# Patient Record
Sex: Female | Born: 1990 | Race: Black or African American | Hispanic: No | Marital: Single | Smoking: Never smoker
Health system: Southern US, Community
[De-identification: ages and names within clinical notes are randomized; demographics above are authoritative.]

## PROBLEM LIST (undated history)

## (undated) DIAGNOSIS — E119 Type 2 diabetes mellitus without complications: Secondary | ICD-10-CM

## (undated) DIAGNOSIS — A4902 Methicillin resistant Staphylococcus aureus infection, unspecified site: Secondary | ICD-10-CM

---

## 2011-08-28 ENCOUNTER — Encounter: Payer: Self-pay | Admitting: Emergency Medicine

## 2011-08-28 ENCOUNTER — Emergency Department (HOSPITAL_COMMUNITY)
Admission: EM | Admit: 2011-08-28 | Discharge: 2011-08-29 | Disposition: A | Discharge status: Home / Self Care | Payer: Self-pay | Attending: Emergency Medicine | Admitting: Emergency Medicine

## 2011-08-28 DIAGNOSIS — F329 Major depressive disorder, single episode, unspecified: Secondary | ICD-10-CM | POA: Insufficient documentation

## 2011-08-28 DIAGNOSIS — R45851 Suicidal ideations: Secondary | ICD-10-CM | POA: Insufficient documentation

## 2011-08-28 DIAGNOSIS — X789XXA Intentional self-harm by unspecified sharp object, initial encounter: Secondary | ICD-10-CM | POA: Insufficient documentation

## 2011-08-28 DIAGNOSIS — E119 Type 2 diabetes mellitus without complications: Secondary | ICD-10-CM | POA: Insufficient documentation

## 2011-08-28 DIAGNOSIS — S41112A Laceration without foreign body of left upper arm, initial encounter: Secondary | ICD-10-CM

## 2011-08-28 DIAGNOSIS — S41109A Unspecified open wound of unspecified upper arm, initial encounter: Secondary | ICD-10-CM | POA: Insufficient documentation

## 2011-08-28 DIAGNOSIS — R509 Fever, unspecified: Secondary | ICD-10-CM | POA: Insufficient documentation

## 2011-08-28 DIAGNOSIS — F3289 Other specified depressive episodes: Secondary | ICD-10-CM | POA: Insufficient documentation

## 2011-08-28 LAB — DIFFERENTIAL
Basophils Absolute: 0 10*3/uL (ref 0.0–0.1)
Basophils Relative: 0 % (ref 0–1)
Eosinophils Absolute: 0.1 10*3/uL (ref 0.0–0.7)
Eosinophils Relative: 1 % (ref 0–5)
Monocytes Absolute: 0.5 10*3/uL (ref 0.1–1.0)

## 2011-08-28 LAB — POCT I-STAT, CHEM 8
Glucose, Bld: 320 mg/dL — ABNORMAL HIGH (ref 70–99)
HCT: 41 % (ref 36.0–46.0)
Hemoglobin: 13.9 g/dL (ref 12.0–15.0)
Potassium: 3.7 mEq/L (ref 3.5–5.1)
Sodium: 138 mEq/L (ref 135–145)
TCO2: 24 mmol/L (ref 0–100)

## 2011-08-28 LAB — RAPID URINE DRUG SCREEN, HOSP PERFORMED
Amphetamines: NOT DETECTED
Benzodiazepines: NOT DETECTED
Tetrahydrocannabinol: NOT DETECTED

## 2011-08-28 LAB — CBC
HCT: 37.9 % (ref 36.0–46.0)
MCH: 24.8 pg — ABNORMAL LOW (ref 26.0–34.0)
MCHC: 31.1 g/dL (ref 30.0–36.0)
MCV: 79.6 fL (ref 78.0–100.0)
RDW: 14.3 % (ref 11.5–15.5)

## 2011-08-28 LAB — URINALYSIS, ROUTINE W REFLEX MICROSCOPIC
Bilirubin Urine: NEGATIVE
Hgb urine dipstick: NEGATIVE
Nitrite: NEGATIVE
Urobilinogen, UA: 0.2 mg/dL (ref 0.0–1.0)

## 2011-08-28 LAB — URINE MICROSCOPIC-ADD ON

## 2011-08-28 LAB — GLUCOSE, CAPILLARY: Glucose-Capillary: 218 mg/dL — ABNORMAL HIGH (ref 70–99)

## 2011-08-28 MED ORDER — SODIUM CHLORIDE 0.9 % IV BOLUS (SEPSIS): 1000.0000 mL | Freq: Once | INTRAVENOUS | Status: DC

## 2011-08-28 MED ORDER — INSULIN REGULAR HUMAN 100 UNIT/ML IJ SOLN: 10.0000 [IU] | Freq: Once | INTRAMUSCULAR | Status: DC

## 2011-08-28 MED ORDER — ALUM & MAG HYDROXIDE-SIMETH 200-200-20 MG/5ML PO SUSP: 30.0000 mL | ORAL | Status: DC | PRN

## 2011-08-28 MED ORDER — LORAZEPAM 1 MG PO TABS: 1.0000 mg | ORAL_TABLET | Freq: Three times a day (TID) | ORAL | Status: DC | PRN

## 2011-08-28 MED ORDER — IBUPROFEN 200 MG PO TABS: 600.0000 mg | ORAL_TABLET | Freq: Three times a day (TID) | ORAL | Status: DC | PRN

## 2011-08-28 MED ORDER — ONDANSETRON HCL 4 MG PO TABS: 4.0000 mg | ORAL_TABLET | Freq: Three times a day (TID) | ORAL | Status: DC | PRN

## 2011-08-28 MED ORDER — TETANUS-DIPHTH-ACELL PERTUSSIS 5-2.5-18.5 LF-MCG/0.5 IM SUSP
0.5000 mL | Freq: Once | INTRAMUSCULAR | Status: AC
  Administered 2011-08-28: 0.5 mL via INTRAMUSCULAR

## 2011-08-28 MED ORDER — INSULIN ASPART 100 UNIT/ML ~~LOC~~ SOLN
10.0000 [IU] | Freq: Once | SUBCUTANEOUS | Status: AC
  Administered 2011-08-28: 10 [IU] via SUBCUTANEOUS

## 2011-08-28 MED ORDER — ZOLPIDEM TARTRATE 5 MG PO TABS: 5.0000 mg | ORAL_TABLET | Freq: Every evening | ORAL | Status: DC | PRN

## 2011-08-28 NOTE — ED Notes (Signed)
Pt alert/oriented x3/pleasent.  Pt acknowledges that she was trying to harm herself, but will not discuss it.  Pt denies HI/avh at this time.  Pt w/ numerous superficial lac's on inner lt forearm, no bleeding noted.  Pt denies pain/discomfort.  NAD, procedures explained, oriented to room and unit.

## 2011-08-28 NOTE — ED Provider Notes (Signed)
History     CSN: [id number] Arrival date & time: 08/28/2011  4:40 PM   First MD Initiated Contact with Patient 08/28/11 1610    Patient is a 20 y.o. female presenting with skin laceration.  Laceration  The incident occurred yesterday. The laceration is located on the left arm. Size: multiple superficial lacerations on anterior forearm. The pain is mild. The pain has been constant since onset. She reports no foreign bodies present.  Patient is not forthcoming with history. Does not specifically state she was trying to harm herself. Denies HI, or hallucinations. Reports a history of cutting in the past.   History reviewed. No pertinent past medical history.  No past surgical history on file.  No family history on file.  History  Substance Use Topics  . Smoking status: Never Smoker   . Smokeless tobacco: Not on file  . Alcohol Use: No    OB History    Grav Para Term Preterm Abortions TAB SAB Ect Mult Living                  Review of Systems  Constitutional: Positive for fever and chills.  Skin: Positive for wound.  Psychiatric/Behavioral: Positive for self-injury. Negative for suicidal ideas and hallucinations.  All other systems reviewed and are negative.    Allergies  Peanut-containing drug products and Tylenol  Home Medications   Current Outpatient Rx  Name Route Sig Dispense Refill  . IBUPROFEN 200 MG PO TABS Oral Take 200 mg by mouth every 8 (eight) hours as needed. For pain.       BP 152/102  Pulse 82  Resp 14  Physical Exam  Vitals reviewed. Constitutional: She is oriented to person, place, and time. Vital signs are normal. She appears well-developed and well-nourished. No distress.  HENT:  Head: Normocephalic and atraumatic.  Eyes: Pupils are equal, round, and reactive to light.  Neck: Neck supple.  Pulmonary/Chest: Effort normal.  Neurological: She is alert and oriented to person, place, and time.  Skin: Skin is warm and dry. No rash noted. No  erythema. No pallor.          Multiple superficial lacerations on left anterior forearm  Psychiatric: She exhibits a depressed mood.    ED Course  Procedures (including critical care time)  Labs Reviewed  CBC - Abnormal; Notable for the following:    WBC 13.0 (*)    Hemoglobin 11.8 (*)    MCH 24.8 (*)    All other components within normal limits  DIFFERENTIAL - Abnormal; Notable for the following:    Neutrophils Relative 82 (*)    Neutro Abs 10.6 (*)    All other components within normal limits  POCT I-STAT, CHEM 8 - Abnormal; Notable for the following:    Glucose, Bld 320 (*)    All other components within normal limits  I-STAT, CHEM 8  URINALYSIS, ROUTINE W REFLEX MICROSCOPIC  PREGNANCY, URINE  ETHANOL  URINE RAPID DRUG SCREEN (HOSP PERFORMED)   Patient CBG is elevated. Will treat with insulin subcutaneous 10 units and IV fluids. Patient is in with 2 major side pending decreased CBG. Will likely call Act team afterwards and place patient on a DM med, give referrals for pcp as well   12:14 AM Patient is new onset diabetic. CBG decreased to 218 with 10 units of subcutaneous insulin. Will prescribe metformin as a pulmonary diabetic medication. Strongly recommended patient followup with a primary care physician after psychiatric treatment for further evaluation of, likely,  type 2 diabetes. Patient voices agreement and understanding. I have spoken with Terri with the act team who will come and evaluate the patient psychiatrically.  12:19 AM Place prescription of metformin in patient's chart to be taken with her when receiving treatment in another facility for suicidal ideation. Have scheduled orders for sliding scale and metformin to be given.  MDM            Thomasene Lot, PA 08/29/11 [phone]

## 2011-08-28 NOTE — ED Notes (Signed)
Patient sister in with pt at this time.

## 2011-08-28 NOTE — ED Notes (Signed)
Pt's sister reports that the pt has told several friends/family members that she might be pregnant and has denied it to others.

## 2011-08-28 NOTE — ED Notes (Signed)
Pt to receive IVF per Bridgette PA-Charge Diane aware and will move to acute care when a bed is available.

## 2011-08-28 NOTE — ED Notes (Signed)
Pt last night has superfical laceration lt wrist no bleeding, states that she wants to kill herself, felt this way for 7 yrs,

## 2011-08-29 ENCOUNTER — Telehealth (HOSPITAL_COMMUNITY): Payer: Self-pay | Admitting: *Deleted

## 2011-08-29 LAB — HEMOGLOBIN A1C
Hgb A1c MFr Bld: 9.1 % — ABNORMAL HIGH (ref <5.7)
Mean Plasma Glucose: 214 mg/dL — ABNORMAL HIGH (ref <117)

## 2011-08-29 LAB — GLUCOSE, CAPILLARY: Glucose-Capillary: 178 mg/dL — ABNORMAL HIGH (ref 70–99)

## 2011-08-29 MED ORDER — METFORMIN HCL 500 MG PO TABS: 500.0000 mg | ORAL_TABLET | Freq: Every day | ORAL | Status: DC

## 2011-08-29 MED ORDER — METFORMIN HCL 500 MG PO TABS
500.0000 mg | ORAL_TABLET | Freq: Every day | ORAL | Status: DC
  Administered 2011-08-29: 500 mg via ORAL
  Filled 2011-08-29 (×2): qty 1

## 2011-08-29 MED ORDER — METFORMIN HCL 1000 MG PO TABS: 500.0000 mg | ORAL_TABLET | Freq: Two times a day (BID) | ORAL | Status: DC

## 2011-08-29 MED ORDER — INSULIN ASPART 100 UNIT/ML ~~LOC~~ SOLN
0.0000 [IU] | Freq: Three times a day (TID) | SUBCUTANEOUS | Status: DC
  Administered 2011-08-29: 7 [IU] via SUBCUTANEOUS
  Administered 2011-08-29: 4 [IU] via SUBCUTANEOUS

## 2011-08-29 NOTE — ED Notes (Signed)
Patient is sitting up in the bed

## 2011-08-29 NOTE — ED Provider Notes (Signed)
Pt. Evaluated by telepsych.Cleared for discharge.  No serious intention of harm found.  Nelia Shi, MD 08/29/11 [phone]

## 2011-08-29 NOTE — ED Notes (Signed)
telepsych consult completed, patient pleased w/outcome, patient is safe

## 2011-08-29 NOTE — ED Notes (Signed)
Spoke with Pt and let her know that a telepsych consult has been requested. Explained this process. Pt agreeable.

## 2011-08-29 NOTE — ED Provider Notes (Signed)
Medical screening examination/treatment/procedure(s) were performed by non-physician practitioner and as supervising physician I was immediately available for consultation/collaboration.  Ethelda Chick, MD 08/29/11 [phone]

## 2011-08-29 NOTE — Progress Notes (Addendum)
Assessment Note   Elizabeth Blackburn is an 20 y.o. female. Pt presents with self-inflicted lacerations to left forearm. Admits this was a SI gesture. Pt reports increased stress and depression since moving to [place] 3 months ago from [place]. Pt has been more isolative, working nights and has less support since moving. Admits to prior SI attempt at age 53 via cutting. Does have history of non-suicidal self-harm cutting, with last cut prior to this incident 1 year ago. Pt denies HI or psychosis or SA.  Axis I: Depressive Disorder NOS Axis II: Deferred Axis III: History reviewed. No pertinent past medical history. Axis IV: economic problems, other psychosocial or environmental problems and problems related to social environment Axis V: 31-40 impairment in reality testing  Past Medical History: History reviewed. No pertinent past medical history.  No past surgical history on file.  Family History: No family history on file.  Social History:  reports that she has never smoked. She does not have any smokeless tobacco history on file. She reports that she does not drink alcohol. Her drug history not on file.  Allergies:  Allergies  Allergen Reactions  . Peanut-Containing Drug Products   . Tylenol (Acetaminophen)     Home Medications:  Medications Prior to Admission  Medication Dose Route Frequency Provider Last Rate Last Dose  . alum & mag hydroxide-simeth (MAALOX/MYLANTA) 200-200-20 MG/5ML suspension 30 mL  30 mL Oral PRN Thomasene Lot, PA      . ibuprofen (ADVIL,MOTRIN) tablet 600 mg  600 mg Oral Q8H PRN Thomasene Lot, PA      . insulin aspart (novoLOG) injection 0-20 Units  0-20 Units Subcutaneous TID WC Thomasene Lot, PA   7 Units at 08/29/11 1200  . insulin aspart (novoLOG) injection 10 Units  10 Units Subcutaneous Once Lorenza Evangelist, [place]   10 Units at 08/28/11 2035  . LORazepam (ATIVAN) tablet 1 mg  1 mg Oral Q8H PRN Thomasene Lot, PA      . metFORMIN (GLUCOPHAGE) tablet 500 mg  500  mg Oral QAC supper Thomasene Lot, PA   500 mg at 08/29/11 0351  . ondansetron (ZOFRAN) tablet 4 mg  4 mg Oral Q8H PRN Thomasene Lot, PA      . TDaP (BOOSTRIX) injection 0.5 mL  0.5 mL Intramuscular Once Thomasene Lot, PA   0.5 mL at 08/28/11 1715  . zolpidem (AMBIEN) tablet 5 mg  5 mg Oral QHS PRN Thomasene Lot, PA      . DISCONTD: insulin regular (HUMULIN R,NOVOLIN R) 100 units/mL injection 10 Units  10 Units Subcutaneous Once Thomasene Lot, PA      . DISCONTD: sodium chloride 0.9 % bolus 1,000 mL  1,000 mL Intravenous Once Thomasene Lot, PA       Medications Prior to Admission  Medication Sig Dispense Refill  . metFORMIN (GLUCOPHAGE) 500 MG tablet Take 1 tablet (500 mg total) by mouth at bedtime.  30 tablet  0    OB/GYN Status:  No LMP recorded.  General Assessment Data Assessment Number: 1  Living Arrangements: Relatives (Mom, sister & brother) Can pt return to current living arrangement?: Yes Admission Status: Voluntary Is patient capable of signing voluntary admission?: Yes Transfer from: Acute Hospital Referral Source: Self/Family/Friend  Risk to self Suicidal Ideation: Yes-Currently Present Suicidal Intent: Yes-Currently Present Is patient at risk for suicide?: No Suicidal Plan?: Yes-Currently Present Specify Current Suicidal Plan: Cutting self Access to Means: Yes Specify Access to Suicidal Means: Knives, sharps, etc What has been your use of  drugs/alcohol within the last 12 months?: None Other Self Harm Risks: Recent move to area, lesser support than before, working nights Triggers for Past Attempts: Other (Comment) (depression) Intentional Self Injurious Behavior: Cutting Comment - Self Injurious Behavior: hx of cutting - last cut before this incident was 1 yr ago Factors that decrease suicide risk: Sense of responsibility to family Family Suicide History: No Recent stressful life event(s): Recent negative physical changes;Financial Problems;Other (Comment)  (recent move from [place], "just everything together", stress) Persecutory voices/beliefs?: No Depression: Yes Depression Symptoms: Isolating;Loss of interest in usual pleasures;Fatigue Substance abuse history and/or treatment for substance abuse?: No Suicide prevention information given to non-admitted patients: Not applicable  Risk to Others Homicidal Ideation: No Thoughts of Harm to Others: No Current Homicidal Intent: No Current Homicidal Plan: No Access to Homicidal Means: No Identified Victim: n/a History of harm to others?: No Assessment of Violence: None Noted Violent Behavior Description: n/a Does patient have access to weapons?: No Criminal Charges Pending?: No Does patient have a court date: No  Mental Status Report Appear/Hygiene: Other (Comment) (normal) Eye Contact: Good Motor Activity: Unremarkable Speech: Soft Level of Consciousness: Alert;Quiet/awake Mood: Depressed;Sad Affect: Sad Anxiety Level: Moderate Thought Processes: Coherent;Relevant Judgement: Impaired Orientation: Person;Place;Time;Situation Obsessive Compulsive Thoughts/Behaviors: None  Cognitive Functioning Concentration: Normal Memory: Recent Intact;Remote Intact IQ: Average Insight: Fair Impulse Control: Fair Appetite: Good Weight Loss: 0  Weight Gain: 0  Total Hours of Sleep: 7  Vegetative Symptoms: None  Prior Inpatient/Outpatient Therapy Prior Therapy:  (NONE)            Values / Beliefs Cultural Requests During Hospitalization: None Spiritual Requests During Hospitalization: None        Additional Information 1:1 In Past 12 Months?: No CIRT Risk: No Elopement Risk: No Does patient have medical clearance?: Yes    Discussed case with Dr. Patria Mane, EDP. Requesting Telepsych consult of pt for further recommendation. Completed request and contacted [facility]. Awaiting telepsych consult to be completed.  Disposition:   Telepsych completed with recommendation of  rescinding IVC. Received telepsych report. Discussed with EDP. Provided OPT referral listing for pt. Pt to be d/c with follow up instructions.  On Site Evaluation by:   Reviewed with Physician:     Romeo Apple 08/29/2011 1:47 PM

## 2011-08-29 NOTE — ED Notes (Signed)
Pt recently up to BR, now laying down in bed. NAD. Safety maintained.

## 2011-08-29 NOTE — ED Notes (Signed)
Has eaten breakfast, lying quietly in bed, voicing no complaints, watching TV, patient is safe

## 2011-08-29 NOTE — ED Notes (Signed)
Breakfast tray delivered

## 2013-12-17 ENCOUNTER — Encounter: Payer: Self-pay | Admitting: Nurse Practitioner

## 2014-04-04 ENCOUNTER — Emergency Department (HOSPITAL_COMMUNITY)
Admission: EM | Admit: 2014-04-04 | Discharge: 2014-04-05 | Disposition: A | Discharge status: Home / Self Care | Payer: Self-pay | Attending: Emergency Medicine | Admitting: Emergency Medicine

## 2014-04-04 ENCOUNTER — Encounter (HOSPITAL_COMMUNITY): Payer: Self-pay | Admitting: Emergency Medicine

## 2014-04-04 DIAGNOSIS — Z79899 Other long term (current) drug therapy: Secondary | ICD-10-CM | POA: Insufficient documentation

## 2014-04-04 DIAGNOSIS — R112 Nausea with vomiting, unspecified: Secondary | ICD-10-CM | POA: Insufficient documentation

## 2014-04-04 DIAGNOSIS — R197 Diarrhea, unspecified: Secondary | ICD-10-CM | POA: Insufficient documentation

## 2014-04-04 DIAGNOSIS — R51 Headache: Secondary | ICD-10-CM | POA: Insufficient documentation

## 2014-04-04 DIAGNOSIS — R519 Headache, unspecified: Secondary | ICD-10-CM

## 2014-04-04 DIAGNOSIS — R1011 Right upper quadrant pain: Secondary | ICD-10-CM | POA: Insufficient documentation

## 2014-04-04 DIAGNOSIS — R1013 Epigastric pain: Secondary | ICD-10-CM | POA: Insufficient documentation

## 2014-04-04 DIAGNOSIS — Z3202 Encounter for pregnancy test, result negative: Secondary | ICD-10-CM | POA: Insufficient documentation

## 2014-04-04 DIAGNOSIS — R1012 Left upper quadrant pain: Secondary | ICD-10-CM | POA: Insufficient documentation

## 2014-04-04 LAB — URINE MICROSCOPIC-ADD ON

## 2014-04-04 LAB — CBC WITH DIFFERENTIAL/PLATELET
BASOS ABS: 0 10*3/uL (ref 0.0–0.1)
Basophils Relative: 0 % (ref 0–1)
EOS PCT: 1 % (ref 0–5)
Eosinophils Absolute: 0.1 10*3/uL (ref 0.0–0.7)
HEMATOCRIT: 41.1 % (ref 36.0–46.0)
Hemoglobin: 13.5 g/dL (ref 12.0–15.0)
LYMPHS ABS: 4.2 10*3/uL — AB (ref 0.7–4.0)
LYMPHS PCT: 36 % (ref 12–46)
MCH: 26.6 pg (ref 26.0–34.0)
MCHC: 32.8 g/dL (ref 30.0–36.0)
MCV: 81.1 fL (ref 78.0–100.0)
MONO ABS: 0.8 10*3/uL (ref 0.1–1.0)
Monocytes Relative: 7 % (ref 3–12)
NEUTROS ABS: 6.3 10*3/uL (ref 1.7–7.7)
Neutrophils Relative %: 56 % (ref 43–77)
PLATELETS: 329 10*3/uL (ref 150–400)
RBC: 5.07 MIL/uL (ref 3.87–5.11)
RDW: 13.6 % (ref 11.5–15.5)
WBC: 11.4 10*3/uL — AB (ref 4.0–10.5)

## 2014-04-04 LAB — COMPREHENSIVE METABOLIC PANEL
ALT: 11 U/L (ref 0–35)
AST: 13 U/L (ref 0–37)
Albumin: 3.5 g/dL (ref 3.5–5.2)
Alkaline Phosphatase: 80 U/L (ref 39–117)
BUN: 8 mg/dL (ref 6–23)
CALCIUM: 9.3 mg/dL (ref 8.4–10.5)
CHLORIDE: 98 meq/L (ref 96–112)
CO2: 26 meq/L (ref 19–32)
Creatinine, Ser: 0.4 mg/dL — ABNORMAL LOW (ref 0.50–1.10)
GLUCOSE: 332 mg/dL — AB (ref 70–99)
Potassium: 3.7 mEq/L (ref 3.7–5.3)
SODIUM: 138 meq/L (ref 137–147)
Total Protein: 7.7 g/dL (ref 6.0–8.3)

## 2014-04-04 LAB — URINALYSIS, ROUTINE W REFLEX MICROSCOPIC
BILIRUBIN URINE: NEGATIVE
HGB URINE DIPSTICK: NEGATIVE
KETONES UR: NEGATIVE mg/dL
Leukocytes, UA: NEGATIVE
Nitrite: NEGATIVE
PROTEIN: NEGATIVE mg/dL
Specific Gravity, Urine: >1.046 — ABNORMAL HIGH (ref 1.005–1.030)
UROBILINOGEN UA: 0.2 mg/dL (ref 0.0–1.0)
pH: 5 (ref 5.0–8.0)

## 2014-04-04 LAB — POC URINE PREG, ED: PREG TEST UR: NEGATIVE

## 2014-04-04 LAB — LIPASE, BLOOD: Lipase: 12 U/L (ref 11–59)

## 2014-04-04 MED ORDER — KETOROLAC TROMETHAMINE 30 MG/ML IJ SOLN
30.0000 mg | Freq: Once | INTRAMUSCULAR | Status: AC
  Administered 2014-04-04: 30 mg via INTRAVENOUS
  Filled 2014-04-04: qty 1

## 2014-04-04 MED ORDER — METOCLOPRAMIDE HCL 5 MG/ML IJ SOLN
10.0000 mg | Freq: Once | INTRAMUSCULAR | Status: AC
  Administered 2014-04-04: 10 mg via INTRAVENOUS
  Filled 2014-04-04: qty 2

## 2014-04-04 MED ORDER — SODIUM CHLORIDE 0.9 % IV BOLUS (SEPSIS)
1000.0000 mL | Freq: Once | INTRAVENOUS | Status: AC
  Administered 2014-04-04: 1000 mL via INTRAVENOUS

## 2014-04-04 MED ORDER — PROMETHAZINE HCL 25 MG PO TABS: 25.0000 mg | ORAL_TABLET | Freq: Four times a day (QID) | ORAL | Status: DC | PRN

## 2014-04-04 MED ORDER — DIPHENHYDRAMINE HCL 50 MG/ML IJ SOLN
25.0000 mg | Freq: Once | INTRAMUSCULAR | Status: AC
  Administered 2014-04-04: 25 mg via INTRAVENOUS
  Filled 2014-04-04: qty 1

## 2014-04-04 NOTE — ED Notes (Signed)
Pt states that she doesn't have to use the restroom at this time

## 2014-04-04 NOTE — ED Provider Notes (Signed)
CSN: [id number]     Arrival date & time 04/04/14  1947 History   First MD Initiated Contact with Patient 04/04/14 2012     Chief Complaint  Patient presents with  . Emesis  . Headache     (Consider location/radiation/quality/duration/timing/severity/associated sxs/prior Treatment) HPI Comments: Patient presents today with a chief complaint of headache, vomiting, and diarrhea.  She reports that the vomiting started yesterday.  She reports that diarrhea and headache began this morning.  She states that she has had several episodes of vomiting and diarrhea.  She denies blood in her emesis or blood in her stool.  She also complaining of epigastric abdominal pain that she started having today.  Pain has been constant.  Pain does not radiate.  Nothing makes the pain worse.   She has not taken anything for her symptoms.  She denies urinary symptoms.  Denies vaginal discharge.  Denies fever or chills.    She reports that her headache has been constant since this morning.  Headache gradual in onset and constant.  She describes the pain as a dull ache.  No head injury or trauma.  She denies neck pain/stiffness, fever, chills, vision changes.    Patient is a 23 y.o. female presenting with vomiting and headaches. The history is provided by the patient.  Emesis Associated symptoms: headaches   Headache Associated symptoms: vomiting     History reviewed. No pertinent past medical history. History reviewed. No pertinent past surgical history. History reviewed. No pertinent family history. History  Substance Use Topics  . Smoking status: Never Smoker   . Smokeless tobacco: Not on file  . Alcohol Use: No   OB History   Grav Para Term Preterm Abortions TAB SAB Ect Mult Living                 Review of Systems  Gastrointestinal: Positive for vomiting.  Neurological: Positive for headaches.  All other systems reviewed and are negative.     Allergies  Peanut-containing drug products and  Tylenol  Home Medications   Prior to Admission medications   Medication Sig Start Date End Date Taking? Authorizing Provider  Cyanocobalamin (B-12 PO) Take 1 each by mouth daily. gummy   Yes Historical Provider, MD  ibuprofen (ADVIL,MOTRIN) 200 MG tablet Take 400 mg by mouth every 6 (six) hours as needed for headache.   Yes Historical Provider, MD   BP 131/86  Pulse 99  Temp(Src) 98.2 F (36.8 C) (Oral)  Resp 18  Ht 5\' 4"  (1.626 m)  Wt 256 lb (116.121 kg)  BMI 43.92 kg/m2  SpO2 98%  LMP 03/23/2014 Physical Exam  Nursing note and vitals reviewed. Constitutional: She appears well-developed and well-nourished.  HENT:  Head: Normocephalic and atraumatic.  Mouth/Throat: Oropharynx is clear and moist.  Neck: Normal range of motion. Neck supple.  Cardiovascular: Normal rate, regular rhythm and normal heart sounds.   Pulmonary/Chest: Effort normal and breath sounds normal.  Abdominal: Soft. Normal appearance and bowel sounds are normal. She exhibits no distension and no mass. There is no rigidity, no rebound, no guarding, no tenderness at McBurney's point and negative Murphy's sign.  Mild tenderness to palpation of the RUQ, LUQ, and epigastrium.  Pain worse in the epigastrium  Neurological: She is alert. She has normal strength. No cranial nerve deficit or sensory deficit. Coordination and gait normal.  Skin: Skin is warm and dry.  Psychiatric: She has a normal mood and affect.    ED Course  Procedures (including critical  care time) Labs Review Labs Reviewed  CBC WITH DIFFERENTIAL  COMPREHENSIVE METABOLIC PANEL  LIPASE, BLOOD  URINALYSIS, ROUTINE W REFLEX MICROSCOPIC  POC URINE PREG, ED    Imaging Review No results found.   EKG Interpretation None     10:15 PM Reassessed patient.  Patient reports that her headache and nausea have improved.  Will PO challenge and reassess.  11:36 PM Reassessed patient.  Patient tolerating PO liquids.  She reports that her pain has  improved.  Reexamined the abdomen.  Abdomen soft.  Mild tenderness to palpation of the epigastrium.  No rebound or guarding.   MDM   Final diagnoses:  None   Patient presenting with headache, nausea, vomiting, and diarrhea.  Headache improved after given Toradol, Reglan, and Benadryl.  Patient afebrile.  No nuchal rigidity.  Normal neurological exam.  Therefore, do not feel any further studies are indicated at this time.  Patient also with vomiting and diarrhea.  Labs unremarkable aside from mild leukocytosis.  Nausea improved during ED course.  Patient tolerating PO liquids prior to discharge.  Mild epigastric abdominal tenderness to palpation.  No rebound or guarding.  Negative Murphy's sign.  Doubt surgical abdomen. Abdominal pain most likely from repeated vomiting.   Feel that the patient is stable for discharge.      Santiago GladHeather Jameila Keeny, PA-C 04/04/14 [phone]

## 2014-04-04 NOTE — Discharge Instructions (Signed)
Follow up with your primary care doctor about your hospital visit. Continue to hydrate orally.Take all medications as prescribed & use Zofran as directed for nausea & vomiting.  Read the instructions below for reasons to return to the ER.  ° °The 'BRAT' diet is suggested, then progress to diet as tolerated as symptoms abate. Call if bloody stools, persistent diarrhea, vomiting, fever or abdominal pain. °Bananas.  °Rice.  °Applesauce.  °Toast (and other simple starches such as crackers, potatoes, noodles).  ° °SEEK IMMEDIATE MEDICAL ATTENTION IF: ° °You begin having localized abdominal pain that does not go away or becomes severe (The right side could  possibly be appendicitis. In an adult, the left lower portion of the abdomen could be colitis or diverticulitis) °  °A temperature above 101 develops ° °Repeated vomiting occurs (multiple uncontrollable episodes) or you are unable to keep fluids down ° °Blood is being passed in stools or vomit (bright red or black tarry stools).  ° °Return also if you develop chest pain, difficulty breathing, dizziness or fainting, or become confused, poorly responsive, or inconsolable (young children). ° ° °RESOURCE GUIDE ° °Dental Problems ° °Patients with Medicaid: °[facility]                     [facility] °[address]                                           [address] °Phone:  [phone]                                                  Phone:  510-2600 ° °If unable to pay or uninsured, contact:  [facility] or [facility] to become qualified for the adult dental clinic. ° °Chronic Pain Problems °Contact [facility]  297-2271 °Patients need to be referred by their primary care doctor. ° °Insufficient Money for Medicine °Contact [employer]:  call "211" or [facility] 271-5999. ° °No Primary Care Doctor °Call [facility]  832-8000 °Other agencies that provide inexpensive medical care °   [facility]  832-8035 °   [facility]  832-7272 °   [facility]  [phone] °   [facility]  832-4777 °   [facility]  373-0678 °   [facility]  272-1050 ° °Psychological Services °[facility]  [phone] °[facility]  378-7881 °[facility]   800 853-5163 (emergency services 641-4993) ° °Substance Abuse Resources °[facility]  336-882-2125 °[facility] 336-784-9470 °[facility] 336-285-9073 °[facility] 336-845-3988 °Residential & Outpatient Substance Abuse Program  800-659-3381 ° °Abuse/Neglect °[place] [facility] (336) 641-3795 °[place] [facility] Hotline 800-378-5315 (After Hours) ° °Emergency Shelter ° [facility] (336) 271-5985 ° °Maternity Homes °[facility] (336) 275-9566 °[facility] (704) 372-4663 ° °MRSA Hotline #:   832-7006 ° ° ° °[place] [facility] ° °[facility]     [employer]                            [facility] °[address] [place]                       [address]      [address]  °[place]                                                [place]                            [place] °Phone:  349-3220                                   Phone:  [phone]                 Phone:  342-8140 ° °[facility] °Phone:  342-8316 ° °[place] [facility] Hotline °(336) 342-1394 °(336) 342-3537 (After Hours) ° ° ° ° °

## 2014-04-04 NOTE — ED Notes (Signed)
Pt arrived to the ED with a complaint of emesis and a persistent headache since yesterday.  Pt states she has had 6 episodes of emesis in the last 24 hours.  Pt states that the emesis is linked to either eating or drinking.  Every time the patient does this activity she has emesis.

## 2014-04-07 NOTE — ED Provider Notes (Signed)
Medical screening examination/treatment/procedure(s) were performed by non-physician practitioner and as supervising physician I was immediately available for consultation/collaboration.   EKG Interpretation None        Lucifer Soja, MD 04/07/14 1544 

## 2016-04-20 ENCOUNTER — Encounter (HOSPITAL_COMMUNITY): Payer: Self-pay

## 2016-04-20 ENCOUNTER — Emergency Department (HOSPITAL_COMMUNITY)
Admission: EM | Admit: 2016-04-20 | Discharge: 2016-04-20 | Disposition: A | Discharge status: Home / Self Care | Payer: BLUE CROSS/BLUE SHIELD | Attending: Emergency Medicine | Admitting: Emergency Medicine

## 2016-04-20 DIAGNOSIS — L03312 Cellulitis of back [any part except buttock]: Secondary | ICD-10-CM | POA: Diagnosis not present

## 2016-04-20 DIAGNOSIS — R739 Hyperglycemia, unspecified: Secondary | ICD-10-CM

## 2016-04-20 DIAGNOSIS — Z7984 Long term (current) use of oral hypoglycemic drugs: Secondary | ICD-10-CM | POA: Diagnosis not present

## 2016-04-20 DIAGNOSIS — L0291 Cutaneous abscess, unspecified: Secondary | ICD-10-CM

## 2016-04-20 DIAGNOSIS — E1165 Type 2 diabetes mellitus with hyperglycemia: Secondary | ICD-10-CM | POA: Insufficient documentation

## 2016-04-20 DIAGNOSIS — L02212 Cutaneous abscess of back [any part, except buttock]: Secondary | ICD-10-CM | POA: Diagnosis not present

## 2016-04-20 DIAGNOSIS — Z791 Long term (current) use of non-steroidal anti-inflammatories (NSAID): Secondary | ICD-10-CM | POA: Insufficient documentation

## 2016-04-20 DIAGNOSIS — L0390 Cellulitis, unspecified: Secondary | ICD-10-CM

## 2016-04-20 DIAGNOSIS — Z79899 Other long term (current) drug therapy: Secondary | ICD-10-CM | POA: Insufficient documentation

## 2016-04-20 HISTORY — DX: Type 2 diabetes mellitus without complications: E11.9

## 2016-04-20 LAB — CBC WITH DIFFERENTIAL/PLATELET
Basophils Absolute: 0 10*3/uL (ref 0.0–0.1)
Basophils Relative: 0 %
EOS ABS: 0.2 10*3/uL (ref 0.0–0.7)
EOS PCT: 1 %
HCT: 36.6 % (ref 36.0–46.0)
HEMOGLOBIN: 12.1 g/dL (ref 12.0–15.0)
LYMPHS PCT: 15 %
Lymphs Abs: 1.9 10*3/uL (ref 0.7–4.0)
MCH: 26.6 pg (ref 26.0–34.0)
MCHC: 33.1 g/dL (ref 30.0–36.0)
MCV: 80.4 fL (ref 78.0–100.0)
Monocytes Absolute: 0.9 10*3/uL (ref 0.1–1.0)
Monocytes Relative: 8 %
NEUTROS PCT: 76 %
Neutro Abs: 9.4 10*3/uL — ABNORMAL HIGH (ref 1.7–7.7)
PLATELETS: 342 10*3/uL (ref 150–400)
RBC: 4.55 MIL/uL (ref 3.87–5.11)
RDW: 13.4 % (ref 11.5–15.5)
WBC: 12.4 10*3/uL — AB (ref 4.0–10.5)

## 2016-04-20 LAB — BASIC METABOLIC PANEL
Anion gap: 10 (ref 5–15)
BUN: 6 mg/dL (ref 6–20)
CO2: 26 mmol/L (ref 22–32)
CREATININE: 0.42 mg/dL — AB (ref 0.44–1.00)
Calcium: 9 mg/dL (ref 8.9–10.3)
Chloride: 99 mmol/L — ABNORMAL LOW (ref 101–111)
Glucose, Bld: 374 mg/dL — ABNORMAL HIGH (ref 65–99)
POTASSIUM: 3.7 mmol/L (ref 3.5–5.1)
SODIUM: 135 mmol/L (ref 135–145)

## 2016-04-20 LAB — CBG MONITORING, ED: GLUCOSE-CAPILLARY: 330 mg/dL — AB (ref 65–99)

## 2016-04-20 LAB — I-STAT BETA HCG BLOOD, ED (MC, WL, AP ONLY)

## 2016-04-20 MED ORDER — CLINDAMYCIN HCL 150 MG PO CAPS: 300.0000 mg | ORAL_CAPSULE | Freq: Three times a day (TID) | ORAL | Status: DC

## 2016-04-20 MED ORDER — LIDOCAINE HCL (PF) 1 % IJ SOLN
30.0000 mL | Freq: Once | INTRAMUSCULAR | Status: AC
  Administered 2016-04-20: 30 mL via INTRADERMAL
  Filled 2016-04-20: qty 30

## 2016-04-20 MED ORDER — OXYCODONE HCL 5 MG PO TABS: 2.5000 mg | ORAL_TABLET | Freq: Four times a day (QID) | ORAL | Status: DC | PRN

## 2016-04-20 MED ORDER — FENTANYL CITRATE (PF) 100 MCG/2ML IJ SOLN
100.0000 ug | Freq: Once | INTRAMUSCULAR | Status: AC
  Administered 2016-04-20: 100 ug via INTRAVENOUS
  Filled 2016-04-20: qty 2

## 2016-04-20 MED ORDER — NAPROXEN 375 MG PO TABS: 375.0000 mg | ORAL_TABLET | Freq: Two times a day (BID) | ORAL | Status: DC

## 2016-04-20 MED ORDER — SODIUM CHLORIDE 0.9 % IV BOLUS (SEPSIS)
500.0000 mL | Freq: Once | INTRAVENOUS | Status: AC
  Administered 2016-04-20: 500 mL via INTRAVENOUS

## 2016-04-20 MED ORDER — ONDANSETRON HCL 4 MG/2ML IJ SOLN
4.0000 mg | Freq: Once | INTRAMUSCULAR | Status: AC
  Administered 2016-04-20: 4 mg via INTRAVENOUS
  Filled 2016-04-20: qty 2

## 2016-04-20 NOTE — Discharge Instructions (Signed)

## 2016-04-20 NOTE — ED Provider Notes (Signed)
CSN: [id number]     Arrival date & time 04/20/16  1025 History   First MD Initiated Contact with Patient 04/20/16 1213     Chief Complaint  Patient presents with  . Abscess     (Consider location/radiation/quality/duration/timing/severity/associated sxs/prior Treatment) HPI  25 year old morbidly obese female who is newly diagnosed with diabetes. The presents emergency Department with chief complaint of abscess of her back. She states she has had 1 other skin infection. However, it was nowhere near as bad as this. She was seen at an urgent care 4 days ago and diagnosis cellulitis. She is started on Keflex. She has had no improvement in her symptoms. Her back has begun to drain. She denies fevers or chills.  Past Medical History  Diagnosis Date  . Diabetes mellitus without complication (HCC)    History reviewed. No pertinent past surgical history. History reviewed. No pertinent family history. Social History  Substance Use Topics  . Smoking status: Never Smoker   . Smokeless tobacco: None  . Alcohol Use: Yes   OB History    No data available     Review of Systems  Ten systems reviewed and are negative for acute change, except as noted in the HPI.    Allergies  Peanut-containing drug products and Tylenol  Home Medications   Prior to Admission medications   Medication Sig Start Date End Date Taking? Authorizing Provider  ALPRAZolam (XANAX) 0.25 MG tablet Take 0.25 mg by mouth 2 (two) times daily as needed for anxiety.   Yes Historical Provider, MD  ibuprofen (ADVIL,MOTRIN) 200 MG tablet Take 400 mg by mouth every 6 (six) hours as needed for headache.   Yes Historical Provider, MD  sertraline (ZOLOFT) 50 MG tablet Take 50 mg by mouth daily.   Yes Historical Provider, MD  clindamycin (CLEOCIN) 150 MG capsule Take 2 capsules (300 mg total) by mouth 3 (three) times daily. May dispense as 150mg  capsules 04/20/16   Arthor CaptainAbigail Ayame Rena, PA-C  glipiZIDE (GLUCOTROL XL) 5 MG 24 hr tablet  Take 5 mg by mouth daily with breakfast.    Historical Provider, MD  naproxen (NAPROSYN) 375 MG tablet Take 1 tablet (375 mg total) by mouth 2 (two) times daily. 04/20/16   Arthor CaptainAbigail Yeilyn Gent, PA-C  ondansetron (ZOFRAN) 4 MG tablet Take 1 tablet (4 mg total) by mouth every 6 (six) hours. 04/21/16   Emi HolesAlexandra M Law, PA-C  oxyCODONE (OXY IR/ROXICODONE) 5 MG immediate release tablet Take 0.5-1 tablets (2.5-5 mg total) by mouth every 6 (six) hours as needed for severe pain. 04/20/16   Eual Lindstrom, PA-C   BP 122/68 mmHg  Pulse 93  Temp(Src) 99 F (37.2 C) (Oral)  Resp 18  SpO2 99% Physical Exam  Constitutional: She is oriented to person, place, and time. She appears well-developed and well-nourished. No distress.  Obese Female in NAD. Appears uncomfortable. Leaning forward with back away from the bed over a pillow.  HENT:  Head: Normocephalic and atraumatic.  Eyes: Conjunctivae are normal. No scleral icterus.  Neck: Normal range of motion.  Cardiovascular: Normal rate, regular rhythm and normal heart sounds.  Exam reveals no gallop and no friction rub.   No murmur heard. Pulmonary/Chest: Effort normal and breath sounds normal. No respiratory distress.  Abdominal: Soft. Bowel sounds are normal. She exhibits no distension and no mass. There is no tenderness. There is no guarding.  Neurological: She is alert and oriented to person, place, and time.  Skin: Skin is warm and dry. She is not diaphoretic.  7 cm area for fluctuance with oozing purulence. A further 4cm circumferential area of induration exists without streaking. Tissue is violaceous  And exquisitely ttp.  Nursing note and vitals reviewed.   ED Course  .Marland Kitchen.Incision and Drainage Date/Time: 04/25/2016 8:15 AM Performed by: Arthor CaptainHARRIS, Asami Lambright Authorized by: Arthor CaptainHARRIS, Emiah Pellicano Consent: Verbal consent obtained. Risks and benefits: risks, benefits and alternatives were discussed Consent given by: patient Patient identity confirmed: verbally with  patient and provided demographic data Time out: Immediately prior to procedure a "time out" was called to verify the correct patient, procedure, equipment, support staff and site/side marked as required. Type: abscess Body area: trunk Location details: back Anesthesia: local infiltration Local anesthetic: lidocaine 1% without epinephrine Anesthetic total: 10 ml Scalpel size: 11 Incision type: single straight Incision depth: dermal Complexity: simple Drainage: purulent and  bloody Drainage amount: copious Packing material: 1/2 in iodoform gauze Patient tolerance: Patient tolerated the procedure well with no immediate complications   (including critical care time) Labs Review Labs Reviewed  BASIC METABOLIC PANEL - Abnormal; Notable for the following:    Chloride 99 (*)    Glucose, Bld 374 (*)    Creatinine, Ser 0.42 (*)    All other components within normal limits  CBC WITH DIFFERENTIAL/PLATELET - Abnormal; Notable for the following:    WBC 12.4 (*)    Neutro Abs 9.4 (*)    All other components within normal limits  CBG MONITORING, ED - Abnormal; Notable for the following:    Glucose-Capillary 330 (*)    All other components within normal limits  I-STAT BETA HCG BLOOD, ED ([facility], [facility], [facility] ONLY)    Imaging Review No results found. I have personally reviewed and evaluated these images and lab results as part of my medical decision-making.   EKG Interpretation None      MDM   Final diagnoses:  Hyperglycemia  Abscess and cellulitis    Pt with large abscess.  Underwent I&D successfully. Large cavity packed with iodoform gauze.  ALthout  Patient is hyperglycemic with a leukocytosis, she is HDS, afebrile,  And without si/sx of sepsis, sirs or systemic infection. The patient has no metabolic acidosis.  Pt. Is advised to return in 48 hours for packing removal. She has close follow up with her pcp regarding her diabetes control She appears     Arthor CaptainAbigail Jawara Latorre,  PA-C 04/25/16 [id number]  Nelva Nayobert Beaton, MD 05/24/16 [phone]

## 2016-04-20 NOTE — ED Notes (Signed)
Pt c/o increasing abscess in center of mid back x 1 week.  Pain score 8/10.  Pt reports being seen by Urgent Care x 4 days ago and diagnosed w/ cellulitis.  Pt was started on Cephalexin.  Yellow discharge noted.

## 2016-04-21 ENCOUNTER — Encounter (HOSPITAL_COMMUNITY): Payer: Self-pay | Admitting: Emergency Medicine

## 2016-04-21 ENCOUNTER — Emergency Department (HOSPITAL_COMMUNITY)
Admission: EM | Admit: 2016-04-21 | Discharge: 2016-04-21 | Disposition: A | Discharge status: Home / Self Care | Payer: BLUE CROSS/BLUE SHIELD | Attending: Emergency Medicine | Admitting: Emergency Medicine

## 2016-04-21 DIAGNOSIS — Z791 Long term (current) use of non-steroidal anti-inflammatories (NSAID): Secondary | ICD-10-CM | POA: Diagnosis not present

## 2016-04-21 DIAGNOSIS — R110 Nausea: Secondary | ICD-10-CM | POA: Diagnosis not present

## 2016-04-21 DIAGNOSIS — Z7984 Long term (current) use of oral hypoglycemic drugs: Secondary | ICD-10-CM | POA: Insufficient documentation

## 2016-04-21 DIAGNOSIS — E119 Type 2 diabetes mellitus without complications: Secondary | ICD-10-CM | POA: Insufficient documentation

## 2016-04-21 DIAGNOSIS — L02212 Cutaneous abscess of back [any part, except buttock]: Secondary | ICD-10-CM | POA: Diagnosis not present

## 2016-04-21 DIAGNOSIS — L0291 Cutaneous abscess, unspecified: Secondary | ICD-10-CM

## 2016-04-21 DIAGNOSIS — Z792 Long term (current) use of antibiotics: Secondary | ICD-10-CM | POA: Diagnosis not present

## 2016-04-21 LAB — CBG MONITORING, ED: GLUCOSE-CAPILLARY: 355 mg/dL — AB (ref 65–99)

## 2016-04-21 MED ORDER — ONDANSETRON HCL 4 MG PO TABS
4.0000 mg | ORAL_TABLET | Freq: Once | ORAL | Status: AC
Start: 2016-04-21 — End: 2016-04-21
  Administered 2016-04-21: 4 mg via ORAL
  Filled 2016-04-21: qty 1

## 2016-04-21 MED ORDER — ONDANSETRON HCL 4 MG PO TABS: 4.0000 mg | ORAL_TABLET | Freq: Four times a day (QID) | ORAL | Status: DC

## 2016-04-21 NOTE — ED Notes (Signed)
CBG 355 

## 2016-04-21 NOTE — ED Notes (Signed)
Pt reports here yesterday for abscess on back; site I/D; request evaluation for blood drainage on dressing noted today.

## 2016-04-21 NOTE — Discharge Instructions (Signed)
Medications: Zofran  Treatment: Take Zofran every 6 hours as needed for nausea or vomiting. Change your dressing after washing the wound with warm, soapy water. Keep packing in until you are seen again in 2 days for wound check  Follow-up: Please follow-up with your primary care provider or return to the emergency department in 2 days for wound recheck. Please return to emergency department sooner if you develop any signs of infection including fever, increasing pain, redness, swelling, drainage. Do expect some blood and drainage.   Abscess An abscess is an infected area that contains a collection of pus and debris.It can occur in almost any part of the body. An abscess is also known as a furuncle or boil. CAUSES  An abscess occurs when tissue gets infected. This can occur from blockage of oil or sweat glands, infection of hair follicles, or a minor injury to the skin. As the body tries to fight the infection, pus collects in the area and creates pressure under the skin. This pressure causes pain. People with weakened immune systems have difficulty fighting infections and get certain abscesses more often.  SYMPTOMS Usually an abscess develops on the skin and becomes a painful mass that is red, warm, and tender. If the abscess forms under the skin, you may feel a moveable soft area under the skin. Some abscesses break open (rupture) on their own, but most will continue to get worse without care. The infection can spread deeper into the body and eventually into the bloodstream, causing you to feel ill.  DIAGNOSIS  Your caregiver will take your medical history and perform a physical exam. A sample of fluid may also be taken from the abscess to determine what is causing your infection. TREATMENT  Your caregiver may prescribe antibiotic medicines to fight the infection. However, taking antibiotics alone usually does not cure an abscess. Your caregiver may need to make a small cut (incision) in the  abscess to drain the pus. In some cases, gauze is packed into the abscess to reduce pain and to continue draining the area. HOME CARE INSTRUCTIONS   Only take over-the-counter or prescription medicines for pain, discomfort, or fever as directed by your caregiver.  If you were prescribed antibiotics, take them as directed. Finish them even if you start to feel better.  If gauze is used, follow your caregiver's directions for changing the gauze.  To avoid spreading the infection:  Keep your draining abscess covered with a bandage.  Wash your hands well.  Do not share personal care items, towels, or whirlpools with others.  Avoid skin contact with others.  Keep your skin and clothes clean around the abscess.  Keep all follow-up appointments as directed by your caregiver. SEEK MEDICAL CARE IF:   You have increased pain, swelling, redness, fluid drainage, or bleeding.  You have muscle aches, chills, or a general ill feeling.  You have a fever. MAKE SURE YOU:   Understand these instructions.  Will watch your condition.  Will get help right away if you are not doing well or get worse.   This information is not intended to replace advice given to you by your health care provider. Make sure you discuss any questions you have with your health care provider.   Document Released: 07/18/2005 Document Revised: 04/08/2012 Document Reviewed: 12/21/2011 Elsevier Interactive Patient Education 2016 Elsevier Inc[employer]   Incision and Drainage Incision and drainage is a procedure in which a sac-like structure (cystic structure) is opened and drained. The area to be  drained usually contains material such as pus, fluid, or blood.  LET YOUR CAREGIVER KNOW ABOUT:   Allergies to medicine.  Medicines taken, including vitamins, herbs, eyedrops, over-the-counter medicines, and creams.  Use of steroids (by mouth or creams).  Previous problems with anesthetics or numbing medicines.  History of  bleeding problems or blood clots.  Previous surgery.  Other health problems, including diabetes and kidney problems.  Possibility of pregnancy, if this applies. RISKS AND COMPLICATIONS  Pain.  Bleeding.  Scarring.  Infection. BEFORE THE PROCEDURE  You may need to have an ultrasound or other imaging tests to see how large or deep your cystic structure is. Blood tests may also be used to determine if you have an infection or how severe the infection is. You may need to have a tetanus shot. PROCEDURE  The affected area is cleaned with a cleaning fluid. The cyst area will then be numbed with a medicine (local anesthetic). A small incision will be made in the cystic structure. A syringe or catheter may be used to drain the contents of the cystic structure, or the contents may be squeezed out. The area will then be flushed with a cleansing solution. After cleansing the area, it is often gently packed with a gauze or another wound dressing. Once it is packed, it will be covered with gauze and tape or some other type of wound dressing. AFTER THE PROCEDURE   Often, you will be allowed to go home right after the procedure.  You may be given antibiotic medicine to prevent or heal an infection.  If the area was packed with gauze or some other wound dressing, you will likely need to come back in 1 to 2 days to get it removed.  The area should heal in about 14 days.   This information is not intended to replace advice given to you by your health care provider. Make sure you discuss any questions you have with your health care provider.   Document Released: 04/03/2001 Document Revised: 04/08/2012 Document Reviewed: 12/03/2011 Elsevier Interactive Patient Education [employer] Elsevier Inc.

## 2016-04-21 NOTE — ED Provider Notes (Signed)
CSN: [id number]     Arrival date & time 04/21/16  1226 History  By signing my name below, I, Linna DarnerRussell Turner, attest that this documentation has been prepared under the direction and in the presence of non-physician practitioner, Buel ReamAlexandra Libbey Duce, PA-C. Electronically Signed: Linna Darnerussell Turner, Scribe. 04/21/2016. 12:53 PM.   Chief Complaint  Patient presents with  . Abscess    The history is provided by the patient. No language interpreter was used.     HPI Comments: Elizabeth Blackburn is a 25 y.o. female with PMHx of DM who presents to the Emergency Department complaining of intermittent bleeding from her incision and drainage site since yesterday. Pt was seen here yesterday for an abscess on her back and had an I&D performed. She states that she has noticed bleeding on her dressing; she has not taken her dressing off since the procedure. Pt has been taking the medications that she was prescribed yesterday (Cleocin, Naproxen, Oxycodone) with control of her pain. She notes that she experienced intermittent nausea after the I&D yesterday as well as some nausea when she woke up this morning. She denies new pain in the area, fever, chills, redness, swelling, chest pain, SOB, abdominal pain, vomiting, other pains, or any other associated symptoms. Pt has a PCP and intends to follow up for wound check on Monday.  Pt began taking Metformin 500 mg BID yesterday in addition to glipizide for diabetes.  Past Medical History  Diagnosis Date  . Diabetes mellitus without complication (HCC)    History reviewed. No pertinent past surgical history. No family history on file. Social History  Substance Use Topics  . Smoking status: Never Smoker   . Smokeless tobacco: None  . Alcohol Use: Yes   OB History    No data available     Review of Systems  Constitutional: Negative for fever and chills.  Respiratory: Negative for shortness of breath.   Cardiovascular: Negative for chest pain.  Gastrointestinal: Positive  for nausea. Negative for vomiting and abdominal pain.  Musculoskeletal: Negative for myalgias, back pain and arthralgias.  Skin: Positive for wound (abscess; bleeding from I&D site). Negative for color change and rash.  Psychiatric/Behavioral: The patient is not nervous/anxious.     Allergies  Peanut-containing drug products and Tylenol  Home Medications   Prior to Admission medications   Medication Sig Start Date End Date Taking? Authorizing Provider  ALPRAZolam (XANAX) 0.25 MG tablet Take 0.25 mg by mouth 2 (two) times daily as needed for anxiety.   Yes Historical Provider, MD  clindamycin (CLEOCIN) 150 MG capsule Take 2 capsules (300 mg total) by mouth 3 (three) times daily. May dispense as 150mg  capsules 04/20/16  Yes Abigail Harris, PA-C  glipiZIDE (GLUCOTROL XL) 5 MG 24 hr tablet Take 5 mg by mouth daily with breakfast.   Yes Historical Provider, MD  ibuprofen (ADVIL,MOTRIN) 200 MG tablet Take 400 mg by mouth every 6 (six) hours as needed for headache.   Yes Historical Provider, MD  naproxen (NAPROSYN) 375 MG tablet Take 1 tablet (375 mg total) by mouth 2 (two) times daily. 04/20/16  Yes Arthor CaptainAbigail Harris, PA-C  oxyCODONE (OXY IR/ROXICODONE) 5 MG immediate release tablet Take 0.5-1 tablets (2.5-5 mg total) by mouth every 6 (six) hours as needed for severe pain. 04/20/16  Yes Arthor CaptainAbigail Harris, PA-C  sertraline (ZOLOFT) 50 MG tablet Take 50 mg by mouth daily.   Yes Historical Provider, MD  ondansetron (ZOFRAN) 4 MG tablet Take 1 tablet (4 mg total) by mouth every 6 (six)  hours. 04/21/16   Waylan BogaAlexandra M Clete Kuch, PA-C   BP 147/92 mmHg  Pulse 85  Temp(Src) 98.5 F (36.9 C) (Oral)  Resp 16  SpO2 100%  LMP 03/23/2016 Physical Exam  Constitutional: She appears well-developed and well-nourished. No distress.  HENT:  Head: Normocephalic and atraumatic.  Mouth/Throat: Oropharynx is clear and moist. No oropharyngeal exudate.  Eyes: Conjunctivae are normal. Pupils are equal, round, and reactive to light.  Right eye exhibits no discharge. Left eye exhibits no discharge. No scleral icterus.  Neck: Normal range of motion. Neck supple. No thyromegaly present.  Cardiovascular: Normal rate, regular rhythm, normal heart sounds and intact distal pulses.  Exam reveals no gallop and no friction rub.   No murmur heard. Pulmonary/Chest: Effort normal and breath sounds normal. No stridor. No respiratory distress. She has no wheezes. She has no rales.  Abdominal: Soft. Bowel sounds are normal. She exhibits no distension. There is no tenderness. There is no rebound and no guarding.  Musculoskeletal: She exhibits no edema.  Lymphadenopathy:    She has no cervical adenopathy.  Neurological: She is alert. Coordination normal.  Skin: Skin is warm and dry. No rash noted. She is not diaphoretic. No pallor.  I&D site as indicated in photo below; 15 cm in diameter area of induration across wound extending beyond the ~ 10 cm area of erythema  Psychiatric: She has a normal mood and affect.  Nursing note and vitals reviewed.     ED Course  Procedures (including critical care time)  I removed the packing and washed with 1 L of normal saline. I replaced packing (0.5in) and applied new dressing with abdominal pad and gauze. Scant drainage and bleeding.  DIAGNOSTIC STUDIES: Oxygen Saturation is 95% on RA, adequate by my interpretation.    COORDINATION OF CARE: 12:53 PM Discussed treatment plan with pt at bedside and pt agreed to plan.  Labs Review Labs Reviewed  CBG MONITORING, ED - Abnormal; Notable for the following:    Glucose-Capillary 355 (*)    All other components within normal limits    Imaging Review No results found. I have personally reviewed and evaluated these images and lab results as part of my medical decision-making.   EKG Interpretation None      MDM   Patient returns for check of incision and drainage completed yesterday after concern for blood drainage on her bandage.  I compared  the patient's current wound to a photo she took 2 days ago prior to I&D. The region appears to be well-healing and infection is not spreading. Patient pain improved from prior visit, however patient has been experiencing some intermittent nausea. Afebrile and hemodynamically stable. Pt is instructed to continue with home care and antibiotics. Wound care discussed at length and patient made aware that she may continue to see some bleeding and drainage on the bandage. Patient began taking metformin yesterday. CBG 355, stable from yesterday. Pt has a good understanding of strict return precautions. Patient to return here or see her primary care provider or urgent care in 2 days for wound check. I discussed patient with Dr. Silverio LayYao who is in agreement with plan.   Final diagnoses:  Abscess  Nausea    I personally performed the services described in this documentation, which was scribed in my presence. The recorded information has been reviewed and is accurate.   Emi Holeslexandra M Helmer Dull, PA-C 04/21/16 1651  Richardean Canalavid H Yao, MD 04/21/16 [phone]

## 2016-04-24 ENCOUNTER — Emergency Department (HOSPITAL_COMMUNITY)
Admission: EM | Admit: 2016-04-24 | Discharge: 2016-04-24 | Disposition: A | Discharge status: Home / Self Care | Payer: BLUE CROSS/BLUE SHIELD | Attending: Emergency Medicine | Admitting: Emergency Medicine

## 2016-04-24 ENCOUNTER — Encounter (HOSPITAL_COMMUNITY): Payer: Self-pay

## 2016-04-24 DIAGNOSIS — Z4800 Encounter for change or removal of nonsurgical wound dressing: Secondary | ICD-10-CM | POA: Diagnosis not present

## 2016-04-24 DIAGNOSIS — Z7984 Long term (current) use of oral hypoglycemic drugs: Secondary | ICD-10-CM | POA: Diagnosis not present

## 2016-04-24 DIAGNOSIS — E119 Type 2 diabetes mellitus without complications: Secondary | ICD-10-CM | POA: Insufficient documentation

## 2016-04-24 NOTE — ED Provider Notes (Signed)
CSN: [id number]     Arrival date & time 04/24/16  1616 History   First MD Initiated Contact with Patient 04/24/16 1646     Chief Complaint  Patient presents with  . Wound Check     (Consider location/radiation/quality/duration/timing/severity/associated sxs/prior Treatment) Patient is a 25 y.o. female presenting with wound check. The history is provided by the patient. No language interpreter was used.  Wound Check This is a recurrent problem. The current episode started in the past 7 days. The problem occurs constantly. The problem has been gradually improving. Pertinent negatives include no fever. Nothing aggravates the symptoms. She has tried nothing for the symptoms. The treatment provided moderate relief.  Pt here for recheck of an abscess.  Pt has packing that needs removal.    Past Medical History  Diagnosis Date  . Diabetes mellitus without complication (HCC)    History reviewed. No pertinent past surgical history. History reviewed. No pertinent family history. Social History  Substance Use Topics  . Smoking status: Never Smoker   . Smokeless tobacco: None  . Alcohol Use: Yes   OB History    No data available     Review of Systems  Constitutional: Negative for fever.  All other systems reviewed and are negative.     Allergies  Peanut-containing drug products and Tylenol  Home Medications   Prior to Admission medications   Medication Sig Start Date End Date Taking? Authorizing Provider  ALPRAZolam (XANAX) 0.25 MG tablet Take 0.25 mg by mouth 2 (two) times daily as needed for anxiety.    Historical Provider, MD  clindamycin (CLEOCIN) 150 MG capsule Take 2 capsules (300 mg total) by mouth 3 (three) times daily. May dispense as 150mg  capsules 04/20/16   Arthor CaptainAbigail Harris, PA-C  glipiZIDE (GLUCOTROL XL) 5 MG 24 hr tablet Take 5 mg by mouth daily with breakfast.    Historical Provider, MD  ibuprofen (ADVIL,MOTRIN) 200 MG tablet Take 400 mg by mouth every 6 (six) hours as  needed for headache.    Historical Provider, MD  naproxen (NAPROSYN) 375 MG tablet Take 1 tablet (375 mg total) by mouth 2 (two) times daily. 04/20/16   Arthor CaptainAbigail Harris, PA-C  ondansetron (ZOFRAN) 4 MG tablet Take 1 tablet (4 mg total) by mouth every 6 (six) hours. 04/21/16   Emi HolesAlexandra M Law, PA-C  oxyCODONE (OXY IR/ROXICODONE) 5 MG immediate release tablet Take 0.5-1 tablets (2.5-5 mg total) by mouth every 6 (six) hours as needed for severe pain. 04/20/16   Arthor CaptainAbigail Harris, PA-C  sertraline (ZOLOFT) 50 MG tablet Take 50 mg by mouth daily.    Historical Provider, MD   BP 140/104 mmHg  Pulse 82  Temp(Src) 98.7 F (37.1 C) (Oral)  Resp 18  SpO2 9%  LMP 03/23/2016 Physical Exam  Constitutional: She is oriented to person, place, and time. She appears well-developed and well-nourished.  Musculoskeletal:  Packing removed from wound,  Wound looks good,  No drainage.  Neurological: She is alert and oriented to person, place, and time. She has normal reflexes.  Skin: Skin is warm.  Psychiatric: She has a normal mood and affect.    ED Course  Procedures (including critical care time) Labs Review Labs Reviewed - No data to display  Imaging Review No results found. I have personally reviewed and evaluated these images and lab results as part of my medical decision-making.   EKG Interpretation None      MDM   Final diagnoses:  Abscess packing removal    An  After Visit Summary was printed and given to the patient.    Lonia SkinnerLeslie K EbroSofia, PA-C 04/24/16 1831  Blane OharaJoshua Zavitz, MD 04/24/16 2218

## 2016-04-24 NOTE — ED Notes (Signed)
Pt here to have wound rechecked.  Pt has packing in abscess on back that was lanced here on Friday

## 2016-04-24 NOTE — Discharge Instructions (Signed)

## 2016-05-04 ENCOUNTER — Emergency Department (HOSPITAL_COMMUNITY)
Admission: EM | Admit: 2016-05-04 | Discharge: 2016-05-05 | Disposition: A | Discharge status: Home / Self Care | Payer: BLUE CROSS/BLUE SHIELD | Attending: Emergency Medicine | Admitting: Emergency Medicine

## 2016-05-04 ENCOUNTER — Encounter (HOSPITAL_COMMUNITY): Payer: Self-pay | Admitting: Oncology

## 2016-05-04 DIAGNOSIS — L0291 Cutaneous abscess, unspecified: Secondary | ICD-10-CM

## 2016-05-04 DIAGNOSIS — L02212 Cutaneous abscess of back [any part, except buttock]: Secondary | ICD-10-CM | POA: Diagnosis present

## 2016-05-04 DIAGNOSIS — Z791 Long term (current) use of non-steroidal anti-inflammatories (NSAID): Secondary | ICD-10-CM | POA: Diagnosis not present

## 2016-05-04 DIAGNOSIS — Z7984 Long term (current) use of oral hypoglycemic drugs: Secondary | ICD-10-CM | POA: Diagnosis not present

## 2016-05-04 DIAGNOSIS — L02415 Cutaneous abscess of right lower limb: Secondary | ICD-10-CM | POA: Diagnosis not present

## 2016-05-04 DIAGNOSIS — H00016 Hordeolum externum left eye, unspecified eyelid: Secondary | ICD-10-CM

## 2016-05-04 DIAGNOSIS — H02844 Edema of left upper eyelid: Secondary | ICD-10-CM | POA: Diagnosis not present

## 2016-05-04 DIAGNOSIS — Z79899 Other long term (current) drug therapy: Secondary | ICD-10-CM | POA: Insufficient documentation

## 2016-05-04 DIAGNOSIS — E119 Type 2 diabetes mellitus without complications: Secondary | ICD-10-CM | POA: Insufficient documentation

## 2016-05-04 MED ORDER — DOXYCYCLINE HYCLATE 100 MG PO CAPS: 100.0000 mg | ORAL_CAPSULE | Freq: Two times a day (BID) | ORAL | Status: DC

## 2016-05-04 MED ORDER — CHLORHEXIDINE GLUCONATE SOLN: Status: DC

## 2016-05-04 MED ORDER — OXYCODONE HCL 5 MG PO TABS
5.0000 mg | ORAL_TABLET | Freq: Once | ORAL | Status: AC
  Administered 2016-05-04: 5 mg via ORAL
  Filled 2016-05-04: qty 1

## 2016-05-04 MED ORDER — LIDOCAINE-EPINEPHRINE 2 %-1:100000 IJ SOLN
10.0000 mL | Freq: Once | INTRAMUSCULAR | Status: AC
  Administered 2016-05-04: 10 mL
  Filled 2016-05-04: qty 1

## 2016-05-04 NOTE — ED Notes (Signed)
Pt seen here 2 weeks ago d/t an abscess on her back.  Pt presents tonight for a new abscess to her back as well as to her right inner thigh.

## 2016-05-04 NOTE — Discharge Instructions (Signed)
Abscess An abscess is an infected area that contains a collection of pus and debris.It can occur in almost any part of the body. An abscess is also known as a furuncle or boil. CAUSES  An abscess occurs when tissue gets infected. This can occur from blockage of oil or sweat glands, infection of hair follicles, or a minor injury to the skin. As the body tries to fight the infection, pus collects in the area and creates pressure under the skin. This pressure causes pain. People with weakened immune systems have difficulty fighting infections and get certain abscesses more often.  SYMPTOMS Usually an abscess develops on the skin and becomes a painful mass that is red, warm, and tender. If the abscess forms under the skin, you may feel a moveable soft area under the skin. Some abscesses break open (rupture) on their own, but most will continue to get worse without care. The infection can spread deeper into the body and eventually into the bloodstream, causing you to feel ill.  DIAGNOSIS  Your caregiver will take your medical history and perform a physical exam. A sample of fluid may also be taken from the abscess to determine what is causing your infection. TREATMENT  Your caregiver may prescribe antibiotic medicines to fight the infection. However, taking antibiotics alone usually does not cure an abscess. Your caregiver may need to make a small cut (incision) in the abscess to drain the pus. In some cases, gauze is packed into the abscess to reduce pain and to continue draining the area. HOME CARE INSTRUCTIONS   Only take over-the-counter or prescription medicines for pain, discomfort, or fever as directed by your caregiver.  If you were prescribed antibiotics, take them as directed. Finish them even if you start to feel better.  If gauze is used, follow your caregiver's directions for changing the gauze.  To avoid spreading the infection:  Keep your draining abscess covered with a  bandage.  Wash your hands well.  Do not share personal care items, towels, or whirlpools with others.  Avoid skin contact with others.  Keep your skin and clothes clean around the abscess.  Keep all follow-up appointments as directed by your caregiver. SEEK MEDICAL CARE IF:   You have increased pain, swelling, redness, fluid drainage, or bleeding.  You have muscle aches, chills, or a general ill feeling.  You have a fever. MAKE SURE YOU:   Understand these instructions.  Will watch your condition.  Will get help right away if you are not doing well or get worse.   This information is not intended to replace advice given to you by your health care provider. Make sure you discuss any questions you have with your health care provider.   Document Released: 07/18/2005 Document Revised: 04/08/2012 Document Reviewed: 12/21/2011 Elsevier Interactive Patient Education 2016 [employer] Stye A stye is a bump on your eyelid caused by a bacterial infection. A stye can form inside the eyelid (internal stye) or outside the eyelid (external stye). An internal stye may be caused by an infected oil-producing gland inside your eyelid. An external stye may be caused by an infection at the base of your eyelash (hair follicle). Styes are very common. Anyone can get them at any age. They usually occur in just one eye, but you may have more than one in either eye.  CAUSES  The infection is almost always caused by bacteria called Staphylococcus aureus. This is a common type of bacteria that lives on your skin. RISK  FACTORS You may be at higher risk for a stye if you have had one before. You may also be at higher risk if you have:  Diabetes.  Long-term illness.  Long-term eye redness.  A skin condition called seborrhea.  High fat levels in your blood (lipids). SIGNS AND SYMPTOMS  Eyelid pain is the most common symptom of a stye. Internal styes are more painful than external styes. Other  signs and symptoms may include:  Painful swelling of your eyelid.  A scratchy feeling in your eye.  Tearing and redness of your eye.  Pus draining from the stye. DIAGNOSIS  Your health care provider may be able to diagnose a stye just by examining your eye. The health care provider may also check to make sure:  You do not have a fever or other signs of a more serious infection.  The infection has not spread to other parts of your eye or areas around your eye. TREATMENT  Most styes will clear up in a few days without treatment. In some cases, you may need to use antibiotic drops or ointment to prevent infection. Your health care provider may have to drain the stye surgically if your stye is:  Large.  Causing a lot of pain.  Interfering with your vision. This can be done using a thin blade or a needle.  HOME CARE INSTRUCTIONS   Take medicines only as directed by your health care provider.  Apply a clean, warm compress to your eye for 10 minutes, 4 times a day.  Do not wear contact lenses or eye makeup until your stye has healed.  Do not try to pop or drain the stye. SEEK MEDICAL CARE IF:  You have chills or a fever.  Your stye does not go away after several days.  Your stye affects your vision.  Your eyeball becomes swollen, red, or painful. MAKE SURE YOU:  Understand these instructions.  Will watch your condition.  Will get help right away if you are not doing well or get worse.   This information is not intended to replace advice given to you by your health care provider. Make sure you discuss any questions you have with your health care provider.   Document Released: 07/18/2005 Document Revised: 10/29/2014 Document Reviewed: 01/22/2014 Elsevier Interactive Patient Education [employer]

## 2016-05-04 NOTE — ED Provider Notes (Signed)
CSN: [id number]     Arrival date & time 05/04/16  1941 History   First MD Initiated Contact with Patient 05/04/16 2211     Chief Complaint  Patient presents with  . Abscess     (Consider location/radiation/quality/duration/timing/severity/associated sxs/prior Treatment) HPI Comments: Patient presents to the ED with a chief complaint of abscesses.  She states that she has a recurrent abscess to her back and a new abscess developing on her right thigh.  She is a diabetic, but does not check her sugar regularly.  She is seeking follow-up for this.  She denies any fevers, chills, nausea, or vomiting.  The symptoms are worsened with palpation.    Additionally, she states that she is getting treat for a stye with eye drops and using warm compresses.  The history is provided by the patient. No language interpreter was used.    Past Medical History  Diagnosis Date  . Diabetes mellitus without complication (HCC)    History reviewed. No pertinent past surgical history. No family history on file. Social History  Substance Use Topics  . Smoking status: Never Smoker   . Smokeless tobacco: Never Used  . Alcohol Use: Yes   OB History    No data available     Review of Systems  Constitutional: Negative for fever and chills.  Eyes:       Stye  Respiratory: Negative for shortness of breath.   Cardiovascular: Negative for chest pain.  Gastrointestinal: Negative for nausea, vomiting, diarrhea and constipation.  Genitourinary: Negative for dysuria.  Skin:       2x2 cm abscess on back 1x1 cm abscess on right inner thigh  All other systems reviewed and are negative.     Allergies  Peanut-containing drug products and Tylenol  Home Medications   Prior to Admission medications   Medication Sig Start Date End Date Taking? Authorizing Provider  ALPRAZolam (XANAX) 0.25 MG tablet Take 0.25 mg by mouth 2 (two) times daily as needed for anxiety.   Yes Historical Provider, MD  glipiZIDE  (GLUCOTROL XL) 5 MG 24 hr tablet Take 5 mg by mouth daily with breakfast.   Yes Historical Provider, MD  naproxen (NAPROSYN) 375 MG tablet Take 1 tablet (375 mg total) by mouth 2 (two) times daily. Patient taking differently: Take 375 mg by mouth 2 (two) times daily as needed for mild pain or moderate pain.  04/20/16  Yes Abigail Harris, PA-C  ondansetron (ZOFRAN) 4 MG tablet Take 1 tablet (4 mg total) by mouth every 6 (six) hours. 04/21/16  Yes Alexandra M Law, PA-C  oxyCODONE (OXY IR/ROXICODONE) 5 MG immediate release tablet Take 0.5-1 tablets (2.5-5 mg total) by mouth every 6 (six) hours as needed for severe pain. 04/20/16  Yes Arthor CaptainAbigail Harris, PA-C  sertraline (ZOLOFT) 50 MG tablet Take 50 mg by mouth daily.   Yes Historical Provider, MD  SPRINTEC 28 0.25-35 MG-MCG tablet Take 1 tablet by mouth daily.  04/25/16  Yes Historical Provider, MD  tobramycin (TOBREX) 0.3 % ophthalmic solution Place 2 drops into the left eye every hour.  05/02/16  Yes Historical Provider, MD  clindamycin (CLEOCIN) 150 MG capsule Take 2 capsules (300 mg total) by mouth 3 (three) times daily. May dispense as 150mg  capsules Patient not taking: Reported on 05/04/2016 04/20/16   Arthor CaptainAbigail Harris, PA-C  ibuprofen (ADVIL,MOTRIN) 200 MG tablet Take 400 mg by mouth every 6 (six) hours as needed for headache.    Historical Provider, MD   BP 183/109 mmHg  Pulse 100  Temp(Src) 98.2 F (36.8 C) (Oral)  Resp 15  Ht  (1.626 m)  Wt 108.863 kg  BMI 41.18 kg/m2  SpO2 98%  LMP 04/26/2016 (Exact Date) Physical Exam  Constitutional: She is oriented to person, place, and time. She appears well-developed and well-nourished.  HENT:  Head: Normocephalic and atraumatic.  Eyes: Conjunctivae and EOM are normal. Pupils are equal, round, and reactive to light.  Left eye stye on upper eye lid, mild discharge and swelling  Neck: Normal range of motion. Neck supple.  Cardiovascular: Normal rate and regular rhythm.  Exam reveals no gallop and no  friction rub.   No murmur heard. Pulmonary/Chest: Effort normal and breath sounds normal. No respiratory distress. She has no wheezes. She has no rales. She exhibits no tenderness.  Abdominal: Soft. Bowel sounds are normal. She exhibits no distension and no mass. There is no tenderness. There is no rebound and no guarding.  Musculoskeletal: Normal range of motion. She exhibits no edema or tenderness.  Neurological: She is alert and oriented to person, place, and time.  Skin: Skin is warm and dry.  2x2 cm abscess on back 1x1 cm abscess on right inner thigh  Psychiatric: She has a normal mood and affect. Her behavior is normal. Judgment and thought content normal.  Nursing note and vitals reviewed.   ED Course  Procedures (including critical care time) INCISION AND DRAINAGE Performed by: Roxy Horseman Consent: Verbal consent obtained. Risks and benefits: risks, benefits and alternatives were discussed Type: abscess  Body area: upper back  Anesthesia: local infiltration  Incision was made with a scalpel.  Local anesthetic: lidocaine 2% with epinephrine  Anesthetic total: 3 ml  Complexity: complex Blunt dissection to break up loculations  Drainage: purulent  Drainage amount: moderate  Packing material: not packed  Patient tolerance: Patient tolerated the procedure well with no immediate complications.   INCISION AND DRAINAGE Performed by: Roxy Horseman Consent: Verbal consent obtained. Risks and benefits: risks, benefits and alternatives were discussed Type: abscess  Body area: right thigh  Anesthesia: local infiltration  Incision was made with a scalpel.  Local anesthetic: lidocaine 2% with epinephrine  Anesthetic total: 2 ml  Complexity: complex Blunt dissection to break up loculations  Drainage: purulent  Drainage amount: mild  Packing material: none  Patient tolerance: Patient tolerated the procedure well with no immediate  complications.     MDM   Final diagnoses:  Abscess  Stye, left    Patient with skin abscess amenable to incision and drainage.  Abscess was not large enough to warrant packing or drain,  wound recheck in 2 days. Encouraged home warm soaks and flushing.  Mild signs of cellulitis is surrounding skin.  Will d/c to home.  Will also give chlorhexadine rinse.  Recommend follow-up with PCP or return here if worsening symptoms.    Patient also has a left eyelid stye.  It is draining.  She has been seen elsewhere for this and is using eye drops and applying warm compresses.  Recommend using baby shampoo and gently milking the eyelid toward midline.  F/u with ophthalmology in 2 days if not better.    Roxy Horseman, PA-C 05/04/16 2347  Mancel Bale, MD 05/05/16 [phone]

## 2016-05-08 ENCOUNTER — Encounter: Payer: BLUE CROSS/BLUE SHIELD | Attending: Family Medicine

## 2016-05-08 VITALS — Ht 63.5 in | Wt 245.0 lb

## 2016-05-08 DIAGNOSIS — E119 Type 2 diabetes mellitus without complications: Secondary | ICD-10-CM | POA: Diagnosis present

## 2016-05-10 NOTE — Progress Notes (Signed)
Patient was seen on 05/08/16 for the first of a series of three diabetes self-management courses at the [facility].  Patient Education Plan per assessed needs and concerns is to attend four course education program for Diabetes Self Management Education.  The following learning objectives were met by the patient during this class:  Describe diabetes  State some common risk factors for diabetes  Defines the role of glucose and insulin  Identifies type of diabetes and pathophysiology  Describe the relationship between diabetes and cardiovascular risk  State the members of the Healthcare Team  States the rationale for glucose monitoring  State when to test glucose  State their individual Target Range  State the importance of logging glucose readings  Describe how to interpret glucose readings  Identifies A1C target  Explain the correlation between A1c and eAG values  State symptoms and treatment of high blood glucose  State symptoms and treatment of low blood glucose  Explain proper technique for glucose testing  Identifies proper sharps disposal  Handouts given during class include:  Living Well with Diabetes book  Carb Counting and Meal Planning book  Meal Plan Card  Carbohydrate guide  Meal planning worksheet  Low Sodium Flavoring Tips  The diabetes portion plate  O7C to eAG Conversion Chart  Diabetes Medications  Diabetes Recommended Care Schedule  Support Group  Diabetes Success Plan  Core Class Satisfaction Survey  Follow-Up Plan:  Attend core 2

## 2016-05-15 ENCOUNTER — Ambulatory Visit: Payer: BLUE CROSS/BLUE SHIELD

## 2016-05-17 ENCOUNTER — Other Ambulatory Visit: Payer: Self-pay | Admitting: Family Medicine

## 2016-05-17 ENCOUNTER — Other Ambulatory Visit (HOSPITAL_COMMUNITY)
Admission: RE | Admit: 2016-05-17 | Discharge: 2016-05-17 | Disposition: A | Discharge status: Home / Self Care | Payer: BLUE CROSS/BLUE SHIELD | Source: Ambulatory Visit | Attending: Family Medicine | Admitting: Family Medicine

## 2016-05-17 DIAGNOSIS — Z01411 Encounter for gynecological examination (general) (routine) with abnormal findings: Secondary | ICD-10-CM | POA: Diagnosis present

## 2016-05-18 LAB — CYTOLOGY - PAP

## 2016-05-22 ENCOUNTER — Ambulatory Visit: Payer: BLUE CROSS/BLUE SHIELD

## 2016-06-13 ENCOUNTER — Encounter (HOSPITAL_COMMUNITY): Payer: Self-pay | Admitting: Emergency Medicine

## 2016-06-13 ENCOUNTER — Emergency Department (HOSPITAL_COMMUNITY)
Admission: EM | Admit: 2016-06-13 | Discharge: 2016-06-13 | Disposition: A | Discharge status: Home / Self Care | Payer: BLUE CROSS/BLUE SHIELD | Attending: Emergency Medicine | Admitting: Emergency Medicine

## 2016-06-13 DIAGNOSIS — L02212 Cutaneous abscess of back [any part, except buttock]: Secondary | ICD-10-CM | POA: Insufficient documentation

## 2016-06-13 DIAGNOSIS — Z791 Long term (current) use of non-steroidal anti-inflammatories (NSAID): Secondary | ICD-10-CM | POA: Insufficient documentation

## 2016-06-13 DIAGNOSIS — E119 Type 2 diabetes mellitus without complications: Secondary | ICD-10-CM | POA: Insufficient documentation

## 2016-06-13 DIAGNOSIS — Z7984 Long term (current) use of oral hypoglycemic drugs: Secondary | ICD-10-CM | POA: Insufficient documentation

## 2016-06-13 DIAGNOSIS — Z79899 Other long term (current) drug therapy: Secondary | ICD-10-CM | POA: Insufficient documentation

## 2016-06-13 DIAGNOSIS — L0291 Cutaneous abscess, unspecified: Secondary | ICD-10-CM

## 2016-06-13 LAB — CBC WITH DIFFERENTIAL/PLATELET
BASOS ABS: 0 10*3/uL (ref 0.0–0.1)
BASOS PCT: 0 %
EOS ABS: 0.2 10*3/uL (ref 0.0–0.7)
Eosinophils Relative: 1 %
HEMATOCRIT: 39.3 % (ref 36.0–46.0)
HEMOGLOBIN: 13.9 g/dL (ref 12.0–15.0)
Lymphocytes Relative: 14 %
Lymphs Abs: 2.3 10*3/uL (ref 0.7–4.0)
MCH: 27.9 pg (ref 26.0–34.0)
MCHC: 35.4 g/dL (ref 30.0–36.0)
MCV: 78.8 fL (ref 78.0–100.0)
MONOS PCT: 7 %
Monocytes Absolute: 1.2 10*3/uL — ABNORMAL HIGH (ref 0.1–1.0)
NEUTROS ABS: 13.2 10*3/uL — AB (ref 1.7–7.7)
NEUTROS PCT: 78 %
Platelets: 279 10*3/uL (ref 150–400)
RBC: 4.99 MIL/uL (ref 3.87–5.11)
RDW: 13.9 % (ref 11.5–15.5)
WBC: 17 10*3/uL — AB (ref 4.0–10.5)

## 2016-06-13 LAB — BASIC METABOLIC PANEL
ANION GAP: 9 (ref 5–15)
BUN: 7 mg/dL (ref 6–20)
CALCIUM: 8.9 mg/dL (ref 8.9–10.3)
CO2: 24 mmol/L (ref 22–32)
CREATININE: 0.35 mg/dL — AB (ref 0.44–1.00)
Chloride: 101 mmol/L (ref 101–111)
GFR calc non Af Amer: >60 mL/min (ref >60)
Glucose, Bld: 359 mg/dL — ABNORMAL HIGH (ref 65–99)
Potassium: 3.9 mmol/L (ref 3.5–5.1)
SODIUM: 134 mmol/L — AB (ref 135–145)

## 2016-06-13 MED ORDER — LIDOCAINE-EPINEPHRINE 2 %-1:100000 IJ SOLN
10.0000 mL | Freq: Once | INTRAMUSCULAR | Status: AC
  Administered 2016-06-13: 10 mL
  Filled 2016-06-13: qty 1

## 2016-06-13 MED ORDER — NAPROXEN 500 MG PO TABS: 500.0000 mg | ORAL_TABLET | Freq: Two times a day (BID) | ORAL | 0 refills | Status: DC

## 2016-06-13 MED ORDER — DOXYCYCLINE HYCLATE 100 MG PO CAPS: 100.0000 mg | ORAL_CAPSULE | Freq: Two times a day (BID) | ORAL | 0 refills | Status: DC

## 2016-06-13 MED ORDER — SODIUM CHLORIDE 0.9 % IV BOLUS (SEPSIS)
500.0000 mL | Freq: Once | INTRAVENOUS | Status: AC
  Administered 2016-06-13: 500 mL via INTRAVENOUS

## 2016-06-13 MED ORDER — KETOROLAC TROMETHAMINE 60 MG/2ML IM SOLN
60.0000 mg | Freq: Once | INTRAMUSCULAR | Status: AC
  Administered 2016-06-13: 60 mg via INTRAMUSCULAR
  Filled 2016-06-13: qty 2

## 2016-06-13 MED ORDER — MORPHINE SULFATE (PF) 4 MG/ML IV SOLN
4.0000 mg | Freq: Once | INTRAVENOUS | Status: AC
  Administered 2016-06-13: 4 mg via INTRAVENOUS
  Filled 2016-06-13: qty 1

## 2016-06-13 NOTE — Discharge Instructions (Addendum)
You have been seen today for an abscess. The abscess was drained successfully. You are being prescribed an antibiotic. Please take all of your antibiotics until finished!   You may develop abdominal discomfort or diarrhea from the antibiotic.  You may help offset this with probiotics which you can buy or get in yogurt. Do not eat or take the probiotics until 2 hours after your antibiotic.   The wound will likely continue to drain. Keep the area clean and dry. See the attached sheets for appropriate wound care.  Follow up with PCP as needed. Return to ED should symptoms worsen. Specifically, return for spreading redness, severely increased pain, fever, or any other major concerns.

## 2016-06-13 NOTE — ED Triage Notes (Signed)
Patient here with complaints of abscess to left side of back. No drainage. Hx of same. Diabetic.

## 2016-06-13 NOTE — ED Provider Notes (Signed)
[facility]-EMERGENCY DEPT Provider Note   CSN: [id number] Arrival date & time: 06/13/16  0749     History   Chief Complaint Chief Complaint  Patient presents with  . Abscess    HPI Elizabeth Blackburn is a 25 y.o. female.  HPI   Elizabeth Blackburn is a 25 y.o. female, with a history of DM, presenting to the ED with a "boil" on her back that she noticed 2-3 days ago. Pain is sharp, moderate, nonradiating. Also endorses nausea. Took tylenol last night without relief. Denies vomiting, fever/chills, shortness of breath, or any other complaints.  Patient adds that her blood sugars are poorly controlled, averaging over 200. This is the third such abscess in the last 6 months. Most recent one was in July 2017. Patient was treated with doxycycline.    Past Medical History:  Diagnosis Date  . Diabetes mellitus without complication (HCC)     There are no active problems to display for this patient.   History reviewed. No pertinent surgical history.  OB History    No data available       Home Medications    Prior to Admission medications   Medication Sig Start Date End Date Taking? Authorizing Provider  ALPRAZolam (XANAX) 0.25 MG tablet Take 0.25 mg by mouth 2 (two) times daily as needed for anxiety.    Historical Provider, MD  CHLORHEXIDINE GLUCONATE, BULK, SOLN Apply while bathing/showering.  Cover skin and cleanse/scrub gently.  Rinse off.  Use until gone. 05/04/16   Roxy Horsemanobert Browning, PA-C  clindamycin (CLEOCIN) 150 MG capsule Take 2 capsules (300 mg total) by mouth 3 (three) times daily. May dispense as 150mg  capsules Patient not taking: Reported on 05/04/2016 04/20/16   Arthor CaptainAbigail Harris, PA-C  doxycycline (VIBRAMYCIN) 100 MG capsule Take 1 capsule (100 mg total) by mouth 2 (two) times daily. 06/13/16   Shawn C Joy, PA-C  glipiZIDE (GLUCOTROL XL) 5 MG 24 hr tablet Take 5 mg by mouth daily with breakfast.    Historical Provider, MD  ibuprofen (ADVIL,MOTRIN) 200 MG tablet Take 400 mg by mouth every 6  (six) hours as needed for headache.    Historical Provider, MD  metFORMIN (GLUCOPHAGE-XR) 750 MG 24 hr tablet Take 750 mg by mouth 2 (two) times daily with a meal.    Historical Provider, MD  naproxen (NAPROSYN) 500 MG tablet Take 1 tablet (500 mg total) by mouth 2 (two) times daily. 06/13/16   Shawn C Joy, PA-C  ondansetron (ZOFRAN) 4 MG tablet Take 1 tablet (4 mg total) by mouth every 6 (six) hours. 04/21/16   Emi HolesAlexandra M Law, PA-C  oxyCODONE (OXY IR/ROXICODONE) 5 MG immediate release tablet Take 0.5-1 tablets (2.5-5 mg total) by mouth every 6 (six) hours as needed for severe pain. 04/20/16   Arthor CaptainAbigail Harris, PA-C  sertraline (ZOLOFT) 50 MG tablet Take 50 mg by mouth daily.    Historical Provider, MD  SPRINTEC 28 0.25-35 MG-MCG tablet Take 1 tablet by mouth daily.  04/25/16   Historical Provider, MD  tobramycin (TOBREX) 0.3 % ophthalmic solution Place 2 drops into the left eye every hour.  05/02/16   Historical Provider, MD    Family History No family history on file.  Social History Social History  Substance Use Topics  . Smoking status: Never Smoker  . Smokeless tobacco: Never Used  . Alcohol use Yes     Allergies   Peanut-containing drug products and Tylenol [acetaminophen]   Review of Systems Review of Systems  Constitutional: Negative for chills,  diaphoresis and fever.  Respiratory: Negative for cough and shortness of breath.   Gastrointestinal: Negative for nausea and vomiting.  Skin:       Abscess on back  All other systems reviewed and are negative.    Physical Exam Updated Vital Signs BP 153/100 (BP Location: Right Arm)   Pulse 120   Temp 99.1 F (37.3 C) (Oral)   Resp 18   SpO2 97%   Physical Exam  Constitutional: She appears well-developed and well-nourished. No distress.  HENT:  Head: Normocephalic and atraumatic.  Eyes: Conjunctivae are normal.  Neck: Neck supple.  Cardiovascular: Normal rate, regular rhythm, normal heart sounds and intact distal pulses.     Pulmonary/Chest: Effort normal and breath sounds normal. No respiratory distress.  Abdominal: Soft. There is no tenderness. There is no guarding.  Musculoskeletal: She exhibits no edema or tenderness.  Lymphadenopathy:    She has no cervical adenopathy.  Neurological: She is alert.  Skin: Skin is warm and dry. She is not diaphoretic.  2 cm, raised fluctuant lesion on the upper left back. 7 x 7 cm area of surrounding erythema, induration and tenderness.  Psychiatric: She has a normal mood and affect. Her behavior is normal.  Nursing note and vitals reviewed.      ED Treatments / Results  Labs (all labs ordered are listed, but only abnormal results are displayed) Labs Reviewed  BASIC METABOLIC PANEL - Abnormal; Notable for the following:       Result Value   Sodium 134 (*)    Glucose, Bld 359 (*)    Creatinine, Ser 0.35 (*)    All other components within normal limits  CBC WITH DIFFERENTIAL/PLATELET - Abnormal; Notable for the following:    WBC 17.0 (*)    Neutro Abs 13.2 (*)    Monocytes Absolute 1.2 (*)    All other components within normal limits  AEROBIC CULTURE (SUPERFICIAL SPECIMEN)    EKG  EKG Interpretation None       Radiology No results found.  Procedures .Marland Kitchen.Incision and Drainage Date/Time: 06/13/2016 10:10 AM Performed by: Anselm PancoastJOY, SHAWN C Authorized by: Harolyn RutherfordJOY, SHAWN C   Consent:    Consent obtained:  Verbal   Consent given by:  Patient   Risks discussed:  Bleeding, incomplete drainage, pain, damage to other organs and infection   Alternatives discussed:  No treatment and alternative treatment Universal protocol:    Patient identity confirmed:  Verbally with patient and arm band Location:    Type:  Abscess   Size:  2x2 cm   Location:  Trunk   Trunk location:  Back Pre-procedure details:    Skin preparation:  Chloraprep Anesthesia (see MAR for exact dosages):    Anesthesia method:  Local infiltration   Local anesthetic:  Lidocaine 2% WITH  epi Procedure type:    Complexity:  Complex (Breaking up loculations) Procedure details:    Needle aspiration: no     Incision types:  Single straight   Incision depth:  Subcutaneous   Scalpel blade:  11   Wound management:  Probed and deloculated, irrigated with saline and extensive cleaning   Drainage:  Bloody and purulent   Drainage amount:  Copious   Wound treatment:  Wound left open   Packing materials:  None Post-procedure details:    Patient tolerance of procedure:  Tolerated well, no immediate complications  .Marland Kitchen.Incision and Drainage Date/Time: 06/13/2016 11:04 AM Performed by: Anselm PancoastJOY, SHAWN C Authorized by: Anselm PancoastJOY, SHAWN C   Consent:  Consent obtained:  Verbal   Consent given by:  Patient   Risks discussed:  Bleeding, incomplete drainage, pain and infection   Alternatives discussed:  No treatment and alternative treatment Universal protocol:    Patient identity confirmed:  Verbally with patient and arm band Location:    Type:  Abscess   Size:  3x3 cm   Location:  Trunk   Trunk location:  Back Pre-procedure details:    Skin preparation:  Chloraprep Anesthesia (see MAR for exact dosages):    Anesthesia method:  Local infiltration   Local anesthetic:  Lidocaine 2% WITH epi Procedure type:    Complexity:  Complex (Breaking up loculations) Procedure details:    Needle aspiration: no     Incision types:  Single straight   Incision depth:  Subcutaneous   Scalpel blade:  11   Wound management:  Probed and deloculated, irrigated with saline and extensive cleaning   Drainage:  Purulent and bloody   Drainage amount:  Moderate   Wound treatment:  Wound left open   Packing materials:  None Post-procedure details:    Patient tolerance of procedure:  Tolerated well, no immediate complications   (including critical care time)  EMERGENCY DEPARTMENT US SOFT TISSUE INTERPRETATION "Study: Limited Ultrasound of the noted body part in comments below"  INDICATIONS: Pain and Soft  tissue infection Multiple views of the body part are obtained with a multi-frequency linear probe  PERFORMED BY:  Myself  IMAGES ARCHIVED?: Yes  SIDE:Left  BODY PART:Upper back  FINDINGS: Abcess present and Cellulitis present  LIMITATIONS:  Body Habitus  INTERPRETATION:  Abcess present and Cellulitis present  COMMENT:  Abscess with cellulitis   Medications Ordered in ED Medications  ketorolac (TORADOL) injection 60 mg (60 mg Intramuscular Given 06/13/16 0924)  morphine 4 MG/ML injection 4 mg (4 mg Intravenous Given 06/13/16 0924)  sodium chloride 0.9 % bolus 500 mL (0 mLs Intravenous Stopped 06/13/16 1142)  lidocaine-EPINEPHrine (XYLOCAINE W/EPI) 2 %-1:100000 (with pres) injection 10 mL (10 mLs Infiltration Given by Other 06/13/16 1219)     Initial Impression / Assessment and Plan / ED Course  I have reviewed the triage vital signs and the nursing notes.  Pertinent labs & imaging results that were available during my care of the patient were reviewed by me and considered in my medical decision making (see chart for details).  Clinical Course    Deborahann Poteat presents with an abscess on the upper left back noticed 2-3 days ago.  Findings and plan of care discussed with Benjiman Core, MD.   What appeared to be 1 large abscess, was actually 2 separate abscesses near each other. Both were amenable to I&D. Tachycardia noted. Patient is afebrile and nontoxic appearing. The patient was given instructions for home care as well as return precautions. Patient voices understanding of these instructions, accepts the plan, and is comfortable with discharge.  Vitals:   06/13/16 0756 06/13/16 1041 06/13/16 1217  BP: 153/100  118/81  Pulse: 120  109  Resp: 18    Temp: 99.1 F (37.3 C) 98.6 F (37 C) 98.3 F (36.8 C)  TempSrc: Oral Oral Oral  SpO2: 97%  99%     Final Clinical Impressions(s) / ED Diagnoses   Final diagnoses:  Abscess    New Prescriptions Discharge  Medication List as of 06/13/2016 11:17 AM       Anselm Pancoast, PA-C 06/13/16 1616    Benjiman Core, MD 06/13/16 [phone]

## 2016-06-13 NOTE — ED Notes (Signed)
Bed: [facility] Expected date:  Expected time:  Means of arrival:  Comments: 

## 2016-06-15 LAB — AEROBIC CULTURE  (SUPERFICIAL SPECIMEN)

## 2016-06-15 LAB — AEROBIC CULTURE W GRAM STAIN (SUPERFICIAL SPECIMEN)

## 2016-06-16 ENCOUNTER — Telehealth (HOSPITAL_COMMUNITY): Payer: Self-pay

## 2016-06-16 NOTE — Telephone Encounter (Signed)
Post ED Visit - Positive Culture Follow-up  Culture report reviewed by antimicrobial stewardship pharmacist:  []  Enzo BiNathan Batchelder, Pharm.D. []  Celedonio MiyamotoJeremy Frens, [address]Ph[address] BCPS []  Garvin FilaMike Maccia, Pharm.D. []  Georgina PillionElizabeth Martin, Pharm.D., BCPS []  HiltonMinh Pham, [address]Ph[address]D., BCPS, AAHIVP []  Estella HuskMichelle Turner, Pharm.D., BCPS, AAHIVP []  Tennis Mustassie Stewart, Pharm.D. []  Sherle Poeob Vincent, [address]Ph[address] Bernie CoveyX  Taylor Stone, Pharm.D.  Positive abscess culture, moderate MRSA Treated with doxycycline, organism sensitive to the same and no further patient follow-up is required at this time.  Arvid RightClark, Emaan Gary Dorn 06/16/2016, 12:28 PM

## 2016-06-20 ENCOUNTER — Encounter (HOSPITAL_COMMUNITY): Payer: Self-pay | Admitting: Emergency Medicine

## 2016-06-20 ENCOUNTER — Emergency Department (HOSPITAL_COMMUNITY)
Admission: EM | Admit: 2016-06-20 | Discharge: 2016-06-20 | Disposition: A | Discharge status: Home / Self Care | Payer: BLUE CROSS/BLUE SHIELD | Attending: Emergency Medicine | Admitting: Emergency Medicine

## 2016-06-20 DIAGNOSIS — L02212 Cutaneous abscess of back [any part, except buttock]: Secondary | ICD-10-CM | POA: Insufficient documentation

## 2016-06-20 DIAGNOSIS — Z7984 Long term (current) use of oral hypoglycemic drugs: Secondary | ICD-10-CM | POA: Insufficient documentation

## 2016-06-20 DIAGNOSIS — E119 Type 2 diabetes mellitus without complications: Secondary | ICD-10-CM | POA: Insufficient documentation

## 2016-06-20 DIAGNOSIS — Z791 Long term (current) use of non-steroidal anti-inflammatories (NSAID): Secondary | ICD-10-CM | POA: Insufficient documentation

## 2016-06-20 DIAGNOSIS — Z79899 Other long term (current) drug therapy: Secondary | ICD-10-CM | POA: Insufficient documentation

## 2016-06-20 LAB — CBG MONITORING, ED: Glucose-Capillary: 414 mg/dL — ABNORMAL HIGH (ref 65–99)

## 2016-06-20 MED ORDER — NAPROXEN 500 MG PO TABS: 500.0000 mg | ORAL_TABLET | Freq: Two times a day (BID) | ORAL | 0 refills | Status: DC

## 2016-06-20 MED ORDER — LIDOCAINE-EPINEPHRINE (PF) 1 %-1:200000 IJ SOLN
20.0000 mL | Freq: Once | INTRAMUSCULAR | Status: AC
  Administered 2016-06-20: 20 mL
  Filled 2016-06-20: qty 30

## 2016-06-20 NOTE — Discharge Instructions (Signed)
Continue taking her antibiotics. Apply warm compresses or do warm soaks 2-3 times per day. Take naproxen as needed for pain. Have your wound rechecked by your primary care doctor in 48 hours. You may return for any new or concerning symptoms.

## 2016-06-20 NOTE — ED Notes (Signed)
I&D at bedside. Waiting on lido w/ epi

## 2016-06-20 NOTE — ED Triage Notes (Signed)
Patient presents for abscess to right mid back x2-3 days, also c/o lower back pain. Denies fever, N/V/D, last BM yesterday was normal for patient. A&O x4.

## 2016-06-20 NOTE — ED Notes (Signed)
Lido w/ epi and betadine at bedside

## 2016-06-20 NOTE — ED Provider Notes (Signed)
[facility]-EMER[facility]ENCY DEPT Provider Note   CSN: [id number] Arrival date & time: 06/20/16  4098 By signing my name below, I, Elizabeth Blackburn, attest that this documentation has been prepared under the direction and in the presence of non-physician practitioner, Antony Madura, PA-C  Electronically Signed: Levon Blackburn, Scribe. 06/20/2016. 11:19 PM.   History   Chief Complaint Chief Complaint  Patient presents with  . Abscess    HPI Elizabeth Blackburn is a 25 y.o. female with hx DM who presents to the Emergency Department complaining of a painful lesion to her right mid back which presented 3 days ago. Pt has been taking hot baths and applying hot compresses to the lesion with no relief. She was seen in the ED on 06/13/16 for an abscess to her upper back. Pt had abscess lanced and was prescribed doxycycline; pt states prior abscess has begun draining again. This is her 4th abscess since June 2017 and states she typically has to have the areas lanced. She denies any sick contact. Pt denies any fever, nausea, vomiting, diarrhea, or drainage from newest lesion.  The history is provided by the patient. No language interpreter was used.   Past Medical History:  Diagnosis Date  . Diabetes mellitus without complication (HCC)     There are no active problems to display for this patient.   History reviewed. No pertinent surgical history.  OB History    No data available      Home Medications    Prior to Admission medications   Medication Sig Start Date End Date Taking? Authorizing Provider  glipiZIDE (GLUCOTROL XL) 5 MG 24 hr tablet Take 5 mg by mouth daily with breakfast.   Yes Historical Provider, MD  metFORMIN (GLUCOPHAGE-XR) 750 MG 24 hr tablet Take 750 mg by mouth 2 (two) times daily with a meal.   Yes Historical Provider, MD  sertraline (ZOLOFT) 50 MG tablet Take 50 mg by mouth daily.   Yes Historical Provider, MD  SPRINTEC 28 0.25-35 MG-MCG tablet Take 1 tablet by mouth daily.  04/25/16  Yes  Historical Provider, MD  ALPRAZolam Prudy Feeler) 0.25 MG tablet Take 0.25 mg by mouth 2 (two) times daily as needed for anxiety.    Historical Provider, MD  CHLORHEXIDINE GLUCONATE, BULK, SOLN Apply while bathing/showering.  Cover skin and cleanse/scrub gently.  Rinse off.  Use until gone. Patient not taking: Reported on 06/20/2016 05/04/16   Roxy Horseman, PA-C  clindamycin (CLEOCIN) 150 MG capsule Take 2 capsules (300 mg total) by mouth 3 (three) times daily. May dispense as 150mg  capsules Patient not taking: Reported on 05/04/2016 04/20/16   Arthor Captain, PA-C  doxycycline (VIBRAMYCIN) 100 MG capsule Take 1 capsule (100 mg total) by mouth 2 (two) times daily. 06/13/16   Shawn C Joy, PA-C  ibuprofen (ADVIL,MOTRIN) 200 MG tablet Take 400 mg by mouth every 6 (six) hours as needed for headache.    Historical Provider, MD  naproxen (NAPROSYN) 500 MG tablet Take 1 tablet (500 mg total) by mouth 2 (two) times daily. For mild/moderate pain 06/20/16   Antony Madura, PA-C  ondansetron (ZOFRAN) 4 MG tablet Take 1 tablet (4 mg total) by mouth every 6 (six) hours. Patient not taking: Reported on 06/20/2016 04/21/16   Emi Holes, PA-C  oxyCODONE (OXY IR/ROXICODONE) 5 MG immediate release tablet Take 0.5-1 tablets (2.5-5 mg total) by mouth every 6 (six) hours as needed for severe pain. Patient not taking: Reported on 06/20/2016 04/20/16   Arthor Captain, PA-C    Family History  No family history on file.  Social History Social History  Substance Use Topics  . Smoking status: Never Smoker  . Smokeless tobacco: Never Used  . Alcohol use Yes     Allergies   Peanut-containing drug products and Tylenol [acetaminophen]   Review of Systems Review of Systems  Constitutional: Negative for fever.  Gastrointestinal: Negative for diarrhea, nausea and vomiting.  Skin: Positive for wound.  All other systems reviewed and are negative.  Physical Exam Updated Vital Signs BP 140/88 (BP Location: Right Arm)   Pulse  114   Temp 98.7 F (37.1 C) (Oral)   Resp 18   LMP 04/26/2016   SpO2 99%   Physical Exam  Constitutional: She is oriented to person, place, and time. She appears well-developed and well-nourished. No distress.  Nontoxic appearing  HENT:  Head: Normocephalic and atraumatic.  Eyes: Conjunctivae and EOM are normal. No scleral icterus.  Neck: Normal range of motion.  Pulmonary/Chest: Effort normal. No respiratory distress.  Respirations even and unlabored  Musculoskeletal: Normal range of motion.       Back:  Neurological: She is alert and oriented to person, place, and time.  Skin: Skin is warm and dry. No rash noted. She is not diaphoretic. No erythema. No pallor.  Psychiatric: She has a normal mood and affect. Her behavior is normal.  Nursing note and vitals reviewed.    ED Treatments / Results  DIAGNOSTIC STUDIES:  Oxygen Saturation is 100% on RA, normal by my interpretation.    COORDINATION OF CARE:  9:38 PM Discussed treatment plan which includes I&D with pt at bedside and pt agreed to plan.   Labs (all labs ordered are listed, but only abnormal results are displayed) Labs Reviewed  CBG MONITORING, ED - Abnormal; Notable for the following:       Result Value   Glucose-Capillary 414 (*)    All other components within normal limits    EKG  EKG Interpretation None       Radiology No results found.  Procedures Procedures (including critical care time)  INCISION AND DRAINAGE Performed by: Antony MaduraHUMES, Milarose Consent: Verbal consent obtained. Risks and benefits: risks, benefits and alternatives were discussed Type: abscess  Body area: mid back  Anesthesia: local infiltration  Incision was made with a scalpel.  Local anesthetic: lidocaine 2% with epinephrine  Anesthetic total: 2.5 ml  Complexity: complex Blunt dissection to break up loculations  Drainage: purulent  Drainage amount: significant  Packing material: none  Patient tolerance: Patient  tolerated the procedure well with no immediate complications.   Medications Ordered in ED Medications  lidocaine-EPINEPHrine (XYLOCAINE-EPINEPHrine) 1 %-1:200000 (PF) injection 20 mL (not administered)     Initial Impression / Assessment and Plan / ED Course  I have reviewed the triage vital signs and the nursing notes.  Pertinent labs & imaging results that were available during my care of the patient were reviewed by me and considered in my medical decision making (see chart for details).  Clinical Course    Patient with skin abscess amenable to incision and drainage. Abscess was not large enough to warrant packing or drain; wound recheck in 2 days advised. Encouraged home warm soaks and flushing. Mild signs of cellulitis is surrounding skin. Patient already on antibiotics. Will manage pain with NSAIDs. Suspect recurrent abscesses secondary to poor sugar control. She has hyperglycemia of 414 today; this is c/w 359 1 week ago which was not associated with DKA. Discussed importance of good diabetic management to prevent recurrence.  Return precautions discussed and provided. Patient discharged in satisfactory condition with no unaddressed concerns.   Final Clinical Impressions(s) / ED Diagnoses   Final diagnoses:  Abscess of back    I personally performed the services described in this documentation, which was scribed in my presence. The recorded information has been reviewed and is accurate.    New Prescriptions New Prescriptions   NAPROXEN (NAPROSYN) 500 MG TABLET    Take 1 tablet (500 mg total) by mouth 2 (two) times daily. For mild/moderate pain       Zaeda Mcferran, PA-C 06/20/16 2324    Benjiman Core, MD 06/21/16 [phone]

## 2016-06-20 NOTE — ED Notes (Signed)
No respiratory or acute distress noted alert and oriented x 3 visitor at bedside call light in reach. 

## 2016-06-20 NOTE — Progress Notes (Signed)
Patient noted to have been seen in the ED 6 times within th last six months. Patient confirms her pcp is Dr. Hyman HopesWebb.  She reports she last saw Dr. Hyman HopesWebb at the end of July. Patient presents to the ED with another boil on her back. [facility] to make another appointment with Dr. Hyman HopesWebb and ask to be referred to a specialist. Patient verbalized understanding.  No further EDCM needs at this time.

## 2016-07-24 ENCOUNTER — Encounter: Payer: Self-pay | Admitting: Emergency Medicine

## 2016-07-24 DIAGNOSIS — E119 Type 2 diabetes mellitus without complications: Secondary | ICD-10-CM | POA: Insufficient documentation

## 2016-07-24 DIAGNOSIS — Z91010 Allergy to peanuts: Secondary | ICD-10-CM | POA: Insufficient documentation

## 2016-07-24 DIAGNOSIS — N611 Abscess of the breast and nipple: Secondary | ICD-10-CM | POA: Insufficient documentation

## 2016-07-24 NOTE — ED Triage Notes (Signed)
Pt states that she had her nipples pierced on 9/22 but tonight she noticed that her R breast is swollen, and one side of the barb is imbedded in her skin. Hx of MRSA infection. Alert and oriented

## 2016-07-25 ENCOUNTER — Emergency Department (HOSPITAL_COMMUNITY)
Admission: EM | Admit: 2016-07-25 | Discharge: 2016-07-25 | Disposition: A | Discharge status: Home / Self Care | Payer: BLUE CROSS/BLUE SHIELD | Attending: Emergency Medicine | Admitting: Emergency Medicine

## 2016-07-25 DIAGNOSIS — N611 Abscess of the breast and nipple: Secondary | ICD-10-CM

## 2016-07-25 LAB — CBC WITH DIFFERENTIAL/PLATELET
BASOS ABS: 0 10*3/uL (ref 0.0–0.1)
Basophils Relative: 0 %
EOS PCT: 1 %
Eosinophils Absolute: 0.1 10*3/uL (ref 0.0–0.7)
HEMATOCRIT: 39.3 % (ref 36.0–46.0)
Hemoglobin: 13.3 g/dL (ref 12.0–15.0)
LYMPHS ABS: 2.7 10*3/uL (ref 0.7–4.0)
LYMPHS PCT: 16 %
MCH: 27.2 pg (ref 26.0–34.0)
MCHC: 33.8 g/dL (ref 30.0–36.0)
MCV: 80.4 fL (ref 78.0–100.0)
MONO ABS: 1.3 10*3/uL — AB (ref 0.1–1.0)
MONOS PCT: 8 %
NEUTROS ABS: 12.9 10*3/uL — AB (ref 1.7–7.7)
Neutrophils Relative %: 75 %
Platelets: 427 10*3/uL — ABNORMAL HIGH (ref 150–400)
RBC: 4.89 MIL/uL (ref 3.87–5.11)
RDW: 13.7 % (ref 11.5–15.5)
WBC: 17 10*3/uL — ABNORMAL HIGH (ref 4.0–10.5)

## 2016-07-25 LAB — I-STAT CHEM 8, ED
BUN: 3 mg/dL — ABNORMAL LOW (ref 6–20)
CALCIUM ION: 1.14 mmol/L — AB (ref 1.15–1.40)
CREATININE: 0.3 mg/dL — AB (ref 0.44–1.00)
Chloride: 94 mmol/L — ABNORMAL LOW (ref 101–111)
GLUCOSE: 492 mg/dL — AB (ref 65–99)
HCT: 43 % (ref 36.0–46.0)
HEMOGLOBIN: 14.6 g/dL (ref 12.0–15.0)
POTASSIUM: 4 mmol/L (ref 3.5–5.1)
Sodium: 134 mmol/L — ABNORMAL LOW (ref 135–145)
TCO2: 28 mmol/L (ref 0–100)

## 2016-07-25 LAB — CBG MONITORING, ED: Glucose-Capillary: 365 mg/dL — ABNORMAL HIGH (ref 65–99)

## 2016-07-25 MED ORDER — SODIUM CHLORIDE 0.9 % IV BOLUS (SEPSIS)
1000.0000 mL | Freq: Once | INTRAVENOUS | Status: AC
  Administered 2016-07-25: 1000 mL via INTRAVENOUS

## 2016-07-25 MED ORDER — VANCOMYCIN HCL IN DEXTROSE 1-5 GM/200ML-% IV SOLN
1000.0000 mg | Freq: Once | INTRAVENOUS | Status: AC
  Administered 2016-07-25: 1000 mg via INTRAVENOUS
  Filled 2016-07-25: qty 200

## 2016-07-25 MED ORDER — OXYCODONE HCL ER 10 MG PO T12A
10.0000 mg | EXTENDED_RELEASE_TABLET | Freq: Two times a day (BID) | ORAL | Status: DC
  Filled 2016-07-25: qty 1

## 2016-07-25 MED ORDER — SULFAMETHOXAZOLE-TRIMETHOPRIM 800-160 MG PO TABS: 1.0000 | ORAL_TABLET | Freq: Two times a day (BID) | ORAL | 0 refills | Status: DC

## 2016-07-25 MED ORDER — HYDROMORPHONE HCL 1 MG/ML IJ SOLN
1.0000 mg | Freq: Once | INTRAMUSCULAR | Status: AC
  Administered 2016-07-25: 1 mg via INTRAVENOUS
  Filled 2016-07-25: qty 1

## 2016-07-25 MED ORDER — ONDANSETRON HCL 4 MG/2ML IJ SOLN
4.0000 mg | Freq: Once | INTRAMUSCULAR | Status: AC
  Administered 2016-07-25: 4 mg via INTRAVENOUS
  Filled 2016-07-25: qty 2

## 2016-07-25 NOTE — ED Provider Notes (Signed)
  Face-to-face evaluation   History: She presents for tenderness on the right greater than left breast. Onset of symptoms several days ago. Recent bilateral nipple piercings. History diabetes.   Physical exam: Alert, calm, cooperative, uncomfortable. Right Breast tender, swollen with palpable abscess versus induration, medial upper quadrant, associated with inverted nipple which has a piercing, stud, embedded tightly against the skin. Mild tenderness and redness left nipple region, with piercing present.  Medical screening examination/treatment/procedure(s) were conducted as a shared visit with non-physician practitioner(s) and myself.  I personally evaluated the patient during the encounter    Mancel BaleElliott Jabez Molner, MD 07/25/16 2008

## 2016-07-25 NOTE — Consult Note (Addendum)
Reason for Consult:right breast abscess Referring Physician: Carlyne, Keehan is an 25 y.o. female.  HPI: 25 yo female who got her nipples pierced on 9/24. 3 days ago she began to have swelling over her right nipple. The swelling has increased each day since, she has been squeezing purulent fluid out her nipple. She denies fevers. She has been having recurrent abscesses over her back. She was diagnosed with diabetes in June 2017. She denies tobacco use.  Past Medical History:  Diagnosis Date  . Diabetes mellitus without complication (HCC)     No past surgical history on file.  No family history on file.  Social History:  reports that she has never smoked. She has never used smokeless tobacco. She reports that she drinks alcohol. She reports that she does not use drugs.  Allergies:  Allergies  Allergen Reactions  . Peanut-Containing Drug Products Rash  . Tylenol [Acetaminophen] Nausea And Vomiting and Rash    Rash on chest    Medications: I have reviewed the patient's current medications.  Results for orders placed or performed during the hospital encounter of 07/25/16 (from the past 48 hour(s))  CBC with Differential     Status: Abnormal   Collection Time: 07/25/16  4:12 AM  Result Value Ref Range   WBC 17.0 (H) 4.0 - 10.5 K/uL   RBC 4.89 3.87 - 5.11 MIL/uL   Hemoglobin 13.3 12.0 - 15.0 g/dL   HCT 16.1 09.6 - 04.5 %   MCV 80.4 78.0 - 100.0 fL   MCH 27.2 26.0 - 34.0 pg   MCHC 33.8 30.0 - 36.0 g/dL   RDW 40.9 81.1 - 91.4 %   Platelets 427 (H) 150 - 400 K/uL   Neutrophils Relative % 75 %   Neutro Abs 12.9 (H) 1.7 - 7.7 K/uL   Lymphocytes Relative 16 %   Lymphs Abs 2.7 0.7 - 4.0 K/uL   Monocytes Relative 8 %   Monocytes Absolute 1.3 (H) 0.1 - 1.0 K/uL   Eosinophils Relative 1 %   Eosinophils Absolute 0.1 0.0 - 0.7 K/uL   Basophils Relative 0 %   Basophils Absolute 0.0 0.0 - 0.1 K/uL  I-stat chem 8, ed     Status: Abnormal   Collection Time: 07/25/16  4:52 AM    Result Value Ref Range   Sodium 134 (L) 135 - 145 mmol/L   Potassium 4.0 3.5 - 5.1 mmol/L   Chloride 94 (L) 101 - 111 mmol/L   BUN 3 (L) 6 - 20 mg/dL   Creatinine, Ser 7.82 (L) 0.44 - 1.00 mg/dL   Glucose, Bld 956 (H) 65 - 99 mg/dL   Calcium, Ion 2.13 (L) 1.15 - 1.40 mmol/L   TCO2 28 0 - 100 mmol/L   Hemoglobin 14.6 12.0 - 15.0 g/dL   HCT 08.6 57.8 - 46.9 %    No results found.  Review of Systems  Constitutional: Negative for chills and fever.  HENT: Negative for hearing loss.   Eyes: Negative for blurred vision and double vision.  Respiratory: Negative for cough and hemoptysis.   Cardiovascular: Positive for chest pain. Negative for palpitations.  Gastrointestinal: Negative for abdominal pain, nausea and vomiting.  Genitourinary: Negative for dysuria and urgency.  Musculoskeletal: Negative for myalgias and neck pain.  Skin: Positive for rash. Negative for itching.  Neurological: Negative for dizziness, tingling and headaches.  Endo/Heme/Allergies: Does not bruise/bleed easily.  Psychiatric/Behavioral: Negative for depression and suicidal ideas.   Blood pressure 121/87, pulse (!) 125,  temperature 98.8 F (37.1 C), temperature source Oral, resp. rate 18, height 5\' 4"  (1.626 m), weight 106.6 kg (235 lb), last menstrual period 05/25/2016, SpO2 98 %. Physical Exam  Vitals reviewed. Constitutional: She is oriented to person, place, and time. She appears well-developed and well-nourished.  HENT:  Head: Normocephalic and atraumatic.  Eyes: Conjunctivae and EOM are normal. Pupils are equal, round, and reactive to light.  Neck: Normal range of motion. Neck supple.  Cardiovascular: S1 normal and S2 normal.  Tachycardia present.   Respiratory: Effort normal and breath sounds normal.  GI: Soft. Bowel sounds are normal. She exhibits no distension. There is no tenderness.  Musculoskeletal: Normal range of motion.  Neurological: She is alert and oriented to person, place, and time.   Skin: Skin is warm and dry.     Psychiatric: She has a normal mood and affect. Her behavior is normal.    Assessment/Plan: 25 yo female with right breast abscess. She has a leukocytosis and large edematous area. At bedside 1.415ml of pus were aspirated. She has hyperglycemia related to infection with last glc 492. Multiple attempts have been made to remove the nipple piercings without success. -MRSA covering abx -imaging of right breast -medicine consult for glucose management  De BlanchLuke Aaron Caliope Ruppert 07/25/2016, 6:41 AM

## 2016-07-25 NOTE — Discharge Instructions (Signed)
You need to follow up with the breast center listed within 48 hours. I recommend calling them this morning to set up this appointment. Please take all of your antibiotics until finished! Return to ER for new or worsening symptoms, any additional concerns.

## 2016-07-25 NOTE — ED Provider Notes (Signed)
[facility]-EMERGENCY DEPT Provider Note   CSN: [id number] Arrival date & time: 07/24/16  2334     History   Chief Complaint Chief Complaint  Patient presents with  . Breast Problem    HPI Elizabeth Blackburn is a 25 y.o. female.  This is an obese female presented emergency department tonight complaining that something discharge from the right nipple as well as swelling surrounding the area after having nipple piercing.  She noticed 2 days ago.  Swelling and pain. Patient is a non-insulin-dependent diabetic as well as have a history of MRSA.      Past Medical History:  Diagnosis Date  . Diabetes mellitus without complication (HCC)     There are no active problems to display for this patient.   No past surgical history on file.  OB History    No data available       Home Medications    Prior to Admission medications   Medication Sig Start Date End Date Taking? Authorizing Provider  ibuprofen (ADVIL,MOTRIN) 200 MG tablet Take 400 mg by mouth every 6 (six) hours as needed for headache.   Yes Historical Provider, MD  naproxen (NAPROSYN) 500 MG tablet Take 1 tablet (500 mg total) by mouth 2 (two) times daily. For mild/moderate pain 06/20/16  Yes Antony MaduraKelly Humes, PA-C  CHLORHEXIDINE GLUCONATE, BULK, SOLN Apply while bathing/showering.  Cover skin and cleanse/scrub gently.  Rinse off.  Use until gone. Patient not taking: Reported on 06/20/2016 05/04/16   Roxy Horsemanobert Browning, PA-C  clindamycin (CLEOCIN) 150 MG capsule Take 2 capsules (300 mg total) by mouth 3 (three) times daily. May dispense as 150mg  capsules Patient not taking: Reported on 05/04/2016 04/20/16   Arthor CaptainAbigail Harris, PA-C  doxycycline (VIBRAMYCIN) 100 MG capsule Take 1 capsule (100 mg total) by mouth 2 (two) times daily. Patient not taking: Reported on 07/25/2016 06/13/16   Shawn C Joy, PA-C  ondansetron (ZOFRAN) 4 MG tablet Take 1 tablet (4 mg total) by mouth every 6 (six) hours. Patient not taking: Reported on 06/20/2016 04/21/16    Emi HolesAlexandra M Law, PA-C  oxyCODONE (OXY IR/ROXICODONE) 5 MG immediate release tablet Take 0.5-1 tablets (2.5-5 mg total) by mouth every 6 (six) hours as needed for severe pain. Patient not taking: Reported on 06/20/2016 04/20/16   Arthor CaptainAbigail Harris, PA-C    Family History No family history on file.  Social History Social History  Substance Use Topics  . Smoking status: Never Smoker  . Smokeless tobacco: Never Used  . Alcohol use Yes     Allergies   Peanut-containing drug products and Tylenol [acetaminophen]   Review of Systems Review of Systems  Constitutional: Negative for chills and fever.  Skin: Positive for color change and wound.  All other systems reviewed and are negative.    Physical Exam Updated Vital Signs BP 121/87 (BP Location: Left Arm)   Pulse (!) 125   Temp 98.8 F (37.1 C) (Oral)   Resp 18   Ht 5\' 4"  (1.626 m)   Wt 106.6 kg   LMP 05/25/2016   SpO2 98%   BMI 40.34 kg/m   Physical Exam  Constitutional: She appears well-developed and well-nourished.  Neck: Normal range of motion.  Cardiovascular: Normal rate.   Pulmonary/Chest: Effort normal.  Musculoskeletal: Normal range of motion.  Skin: There is erythema.     Nursing note and vitals reviewed.    ED Treatments / Results  Labs (all labs ordered are listed, but only abnormal results are displayed) Labs Reviewed  CBC WITH  DIFFERENTIAL/PLATELET - Abnormal; Notable for the following:       Result Value   WBC 17.0 (*)    Platelets 427 (*)    Neutro Abs 12.9 (*)    Monocytes Absolute 1.3 (*)    All other components within normal limits  I-STAT CHEM 8, ED - Abnormal; Notable for the following:    Sodium 134 (*)    Chloride 94 (*)    BUN 3 (*)    Creatinine, Ser 0.30 (*)    Glucose, Bld 492 (*)    Calcium, Ion 1.14 (*)    All other components within normal limits  BODY FLUID CULTURE    EKG  EKG Interpretation None       Radiology No results found.  Procedures Procedures  (including critical care time)  Medications Ordered in ED Medications  vancomycin (VANCOCIN) IVPB 1000 mg/200 mL premix (1,000 mg Intravenous New Bag/Given 07/25/16 0519)  sodium chloride 0.9 % bolus 1,000 mL (1,000 mLs Intravenous New Bag/Given 07/25/16 0424)  HYDROmorphone (DILAUDID) injection 1 mg (1 mg Intravenous Given 07/25/16 0424)  ondansetron (ZOFRAN) injection 4 mg (4 mg Intravenous Given 07/25/16 0424)  sodium chloride 0.9 % bolus 1,000 mL (1,000 mLs Intravenous New Bag/Given 07/25/16 0519)     Initial Impression / Assessment and Plan / ED Course  I have reviewed the triage vital signs and the nursing notes.  Pertinent labs & imaging results that were available during my care of the patient were reviewed by me and considered in my medical decision making (see chart for details).  Clinical Course     I attempted to remove the piercings without success.  I will have nurses start IV start antibiotics, pain control and consult surgery  Final Clinical Impressions(s) / ED Diagnoses   Final diagnoses:  Breast abscess    New Prescriptions New Prescriptions   No medications on file     Earley Favor, NP 07/25/16 0412    Earley Favor, NP 07/25/16 1610    Mancel Bale, MD 07/25/16 2008

## 2016-07-25 NOTE — ED Notes (Signed)
Pt mother Elizabeth Blackburn(Debora) [phone]. Please call with updates.

## 2016-07-25 NOTE — ED Provider Notes (Signed)
Care assumed from previous provider NP Manus RuddSchulz. Please see note for further details. Briefly, patient is a 25 y.o. female with hx of DM who presents with right breast abscess. Gen Surg consulted and I will follow up on their recommendations.   GenSurg Dr. Sheliah HatchKinsinger saw patient: see consultation note. Does not feel that patient needs to be admitted for breast abscess, however she will need close follow up with the breast center within 48 hours. Start on Bactrim.   Discussed this with patient who understands and agrees with follow up care. Patient does have PCP who manages her DM and states she can follow up easily. Continue glipizide and metformin and discussed dietary changes. Reasons to return to ED discussed and all questions answered.    Ms Methodist Rehabilitation CenterJaime Pilcher Ward, PA-C 07/25/16 [id number]    Mancel BaleElliott Wentz, MD 07/25/16 2007

## 2016-07-25 NOTE — ED Provider Notes (Deleted)
xrays and scans reviewed all within normal treated for contusions and abrasuions  DC with parents    Earley FavorGail Devanshi Califf, NP 07/25/16 0406

## 2016-07-30 LAB — AEROBIC/ANAEROBIC CULTURE (SURGICAL/DEEP WOUND)

## 2016-07-30 LAB — AEROBIC/ANAEROBIC CULTURE W GRAM STAIN (SURGICAL/DEEP WOUND)

## 2016-07-31 ENCOUNTER — Observation Stay (HOSPITAL_COMMUNITY): Payer: Self-pay | Admitting: Anesthesiology

## 2016-07-31 ENCOUNTER — Encounter (HOSPITAL_COMMUNITY)
Admission: EM | Disposition: A | Discharge status: Home / Self Care | Payer: Self-pay | Source: Home / Self Care | Attending: Family Medicine

## 2016-07-31 ENCOUNTER — Inpatient Hospital Stay (HOSPITAL_COMMUNITY)
Admission: EM | Admit: 2016-07-31 | Discharge: 2016-08-03 | DRG: 854 | Disposition: A | Discharge status: Home / Self Care | Payer: Self-pay | Attending: Family Medicine | Admitting: Family Medicine

## 2016-07-31 ENCOUNTER — Encounter (HOSPITAL_COMMUNITY): Payer: Self-pay | Admitting: *Deleted

## 2016-07-31 ENCOUNTER — Observation Stay (HOSPITAL_COMMUNITY): Payer: Self-pay

## 2016-07-31 ENCOUNTER — Telehealth (HOSPITAL_BASED_OUTPATIENT_CLINIC_OR_DEPARTMENT_OTHER): Payer: Self-pay | Admitting: Emergency Medicine

## 2016-07-31 DIAGNOSIS — IMO0002 Reserved for concepts with insufficient information to code with codable children: Secondary | ICD-10-CM | POA: Diagnosis present

## 2016-07-31 DIAGNOSIS — E871 Hypo-osmolality and hyponatremia: Secondary | ICD-10-CM | POA: Diagnosis present

## 2016-07-31 DIAGNOSIS — L0390 Cellulitis, unspecified: Secondary | ICD-10-CM | POA: Diagnosis present

## 2016-07-31 DIAGNOSIS — E119 Type 2 diabetes mellitus without complications: Secondary | ICD-10-CM

## 2016-07-31 DIAGNOSIS — L304 Erythema intertrigo: Secondary | ICD-10-CM | POA: Diagnosis present

## 2016-07-31 DIAGNOSIS — E11649 Type 2 diabetes mellitus with hypoglycemia without coma: Secondary | ICD-10-CM | POA: Diagnosis present

## 2016-07-31 DIAGNOSIS — Z79899 Other long term (current) drug therapy: Secondary | ICD-10-CM

## 2016-07-31 DIAGNOSIS — N611 Abscess of the breast and nipple: Secondary | ICD-10-CM | POA: Diagnosis present

## 2016-07-31 DIAGNOSIS — E1165 Type 2 diabetes mellitus with hyperglycemia: Secondary | ICD-10-CM | POA: Diagnosis present

## 2016-07-31 DIAGNOSIS — Z886 Allergy status to analgesic agent status: Secondary | ICD-10-CM

## 2016-07-31 DIAGNOSIS — K5900 Constipation, unspecified: Secondary | ICD-10-CM | POA: Diagnosis present

## 2016-07-31 DIAGNOSIS — Z7984 Long term (current) use of oral hypoglycemic drugs: Secondary | ICD-10-CM

## 2016-07-31 DIAGNOSIS — A419 Sepsis, unspecified organism: Principal | ICD-10-CM | POA: Diagnosis present

## 2016-07-31 DIAGNOSIS — Z6839 Body mass index (BMI) 39.0-39.9, adult: Secondary | ICD-10-CM

## 2016-07-31 DIAGNOSIS — E662 Morbid (severe) obesity with alveolar hypoventilation: Secondary | ICD-10-CM | POA: Diagnosis present

## 2016-07-31 DIAGNOSIS — E118 Type 2 diabetes mellitus with unspecified complications: Secondary | ICD-10-CM

## 2016-07-31 HISTORY — PX: IRRIGATION AND DEBRIDEMENT ABSCESS: SHX5252

## 2016-07-31 LAB — URINALYSIS, ROUTINE W REFLEX MICROSCOPIC
BILIRUBIN URINE: NEGATIVE
Glucose, UA: >1000 mg/dL — AB
HGB URINE DIPSTICK: NEGATIVE
Ketones, ur: 40 mg/dL — AB
Leukocytes, UA: NEGATIVE
Nitrite: NEGATIVE
PROTEIN: NEGATIVE mg/dL
Specific Gravity, Urine: >1.046 — ABNORMAL HIGH (ref 1.005–1.030)
pH: 5.5 (ref 5.0–8.0)

## 2016-07-31 LAB — CBC WITH DIFFERENTIAL/PLATELET
BASOS ABS: 0 10*3/uL (ref 0.0–0.1)
Basophils Relative: 0 %
EOS ABS: 0.2 10*3/uL (ref 0.0–0.7)
EOS PCT: 1 %
HCT: 34.5 % — ABNORMAL LOW (ref 36.0–46.0)
Hemoglobin: 11.4 g/dL — ABNORMAL LOW (ref 12.0–15.0)
LYMPHS PCT: 11 %
Lymphs Abs: 2.4 10*3/uL (ref 0.7–4.0)
MCH: 26.5 pg (ref 26.0–34.0)
MCHC: 33 g/dL (ref 30.0–36.0)
MCV: 80.2 fL (ref 78.0–100.0)
MONO ABS: 1.8 10*3/uL — AB (ref 0.1–1.0)
Monocytes Relative: 8 %
Neutro Abs: 17.7 10*3/uL — ABNORMAL HIGH (ref 1.7–7.7)
Neutrophils Relative %: 80 %
PLATELETS: 377 10*3/uL (ref 150–400)
RBC: 4.3 MIL/uL (ref 3.87–5.11)
RDW: 13.8 % (ref 11.5–15.5)
WBC: 22 10*3/uL — ABNORMAL HIGH (ref 4.0–10.5)

## 2016-07-31 LAB — BASIC METABOLIC PANEL
ANION GAP: 11 (ref 5–15)
BUN: 9 mg/dL (ref 6–20)
CALCIUM: 9 mg/dL (ref 8.9–10.3)
CO2: 26 mmol/L (ref 22–32)
Chloride: 94 mmol/L — ABNORMAL LOW (ref 101–111)
Creatinine, Ser: 0.53 mg/dL (ref 0.44–1.00)
GFR calc Af Amer: >60 mL/min (ref >60)
GLUCOSE: 444 mg/dL — AB (ref 65–99)
Potassium: 3.9 mmol/L (ref 3.5–5.1)
Sodium: 131 mmol/L — ABNORMAL LOW (ref 135–145)

## 2016-07-31 LAB — URINE MICROSCOPIC-ADD ON

## 2016-07-31 LAB — PROTIME-INR
INR: 1.08
PROTHROMBIN TIME: 14.1 s (ref 11.4–15.2)

## 2016-07-31 LAB — SURGICAL PCR SCREEN
MRSA, PCR: NEGATIVE
Staphylococcus aureus: POSITIVE — AB

## 2016-07-31 LAB — GLUCOSE, CAPILLARY
GLUCOSE-CAPILLARY: 272 mg/dL — AB (ref 65–99)
GLUCOSE-CAPILLARY: 299 mg/dL — AB (ref 65–99)
Glucose-Capillary: 329 mg/dL — ABNORMAL HIGH (ref 65–99)
Glucose-Capillary: 274 mg/dL — ABNORMAL HIGH (ref 65–99)

## 2016-07-31 LAB — POC URINE PREG, ED: PREG TEST UR: NEGATIVE

## 2016-07-31 LAB — CBG MONITORING, ED: GLUCOSE-CAPILLARY: 417 mg/dL — AB (ref 65–99)

## 2016-07-31 LAB — I-STAT CG4 LACTIC ACID, ED: Lactic Acid, Venous: 1.28 mmol/L (ref 0.5–1.9)

## 2016-07-31 SURGERY — IRRIGATION AND DEBRIDEMENT ABSCESS
Anesthesia: General | Laterality: Right

## 2016-07-31 MED ORDER — INSULIN ASPART 100 UNIT/ML ~~LOC~~ SOLN
0.0000 [IU] | SUBCUTANEOUS | Status: DC
Start: 2016-07-31 — End: 2016-08-01
  Administered 2016-07-31: 7 [IU] via SUBCUTANEOUS
  Administered 2016-07-31 (×2): 5 [IU] via SUBCUTANEOUS
  Administered 2016-08-01: 3 [IU] via SUBCUTANEOUS
  Administered 2016-08-01: 5 [IU] via SUBCUTANEOUS
  Administered 2016-08-01: 3 [IU] via SUBCUTANEOUS

## 2016-07-31 MED ORDER — SUCCINYLCHOLINE CHLORIDE 20 MG/ML IJ SOLN
INTRAMUSCULAR | Status: AC
  Filled 2016-07-31: qty 1

## 2016-07-31 MED ORDER — SODIUM CHLORIDE 0.9 % IV SOLN
1000.0000 mL | INTRAVENOUS | Status: DC
  Administered 2016-07-31 – 2016-08-02 (×2): 1000 mL via INTRAVENOUS

## 2016-07-31 MED ORDER — SODIUM CHLORIDE 0.9 % IV BOLUS (SEPSIS)
1000.0000 mL | Freq: Once | INTRAVENOUS | Status: AC
  Administered 2016-07-31: 1000 mL via INTRAVENOUS

## 2016-07-31 MED ORDER — MORPHINE SULFATE ER 15 MG PO TBCR
15.0000 mg | EXTENDED_RELEASE_TABLET | Freq: Two times a day (BID) | ORAL | Status: DC
  Administered 2016-07-31 – 2016-08-03 (×6): 15 mg via ORAL
  Filled 2016-07-31 (×7): qty 1

## 2016-07-31 MED ORDER — 0.9 % SODIUM CHLORIDE (POUR BTL) OPTIME
TOPICAL | Status: DC | PRN
  Administered 2016-07-31: 1000 mL

## 2016-07-31 MED ORDER — ONDANSETRON HCL 4 MG PO TABS: 4.0000 mg | ORAL_TABLET | Freq: Four times a day (QID) | ORAL | Status: DC | PRN

## 2016-07-31 MED ORDER — FENTANYL CITRATE (PF) 100 MCG/2ML IJ SOLN
50.0000 ug | INTRAMUSCULAR | Status: AC | PRN
  Administered 2016-07-31 (×4): 50 ug via INTRAVENOUS
  Filled 2016-07-31 (×3): qty 2

## 2016-07-31 MED ORDER — SODIUM CHLORIDE 0.9 % IV SOLN
1000.0000 mL | INTRAVENOUS | Status: DC
  Administered 2016-07-31: 1000 mL via INTRAVENOUS

## 2016-07-31 MED ORDER — ROCURONIUM BROMIDE 10 MG/ML (PF) SYRINGE
PREFILLED_SYRINGE | INTRAVENOUS | Status: AC
  Filled 2016-07-31: qty 10

## 2016-07-31 MED ORDER — MORPHINE SULFATE (PF) 2 MG/ML IV SOLN
2.0000 mg | INTRAVENOUS | Status: DC | PRN
  Administered 2016-07-31 – 2016-08-03 (×12): 2 mg via INTRAVENOUS
  Filled 2016-07-31 (×12): qty 1

## 2016-07-31 MED ORDER — LIDOCAINE 2% (20 MG/ML) 5 ML SYRINGE
INTRAMUSCULAR | Status: AC
  Filled 2016-07-31: qty 5

## 2016-07-31 MED ORDER — BUPIVACAINE HCL (PF) 0.5 % IJ SOLN
INTRAMUSCULAR | Status: AC
  Filled 2016-07-31: qty 30

## 2016-07-31 MED ORDER — METOCLOPRAMIDE HCL 5 MG/ML IJ SOLN
INTRAMUSCULAR | Status: AC
  Filled 2016-07-31: qty 2

## 2016-07-31 MED ORDER — ONDANSETRON HCL 4 MG/2ML IJ SOLN
4.0000 mg | Freq: Four times a day (QID) | INTRAMUSCULAR | Status: DC | PRN
  Administered 2016-07-31 – 2016-08-03 (×2): 4 mg via INTRAVENOUS
  Filled 2016-07-31 (×2): qty 2

## 2016-07-31 MED ORDER — MIDAZOLAM HCL 5 MG/5ML IJ SOLN
INTRAMUSCULAR | Status: DC | PRN
  Administered 2016-07-31: 1 mg via INTRAVENOUS

## 2016-07-31 MED ORDER — SUCCINYLCHOLINE CHLORIDE 20 MG/ML IJ SOLN
INTRAMUSCULAR | Status: DC | PRN
  Administered 2016-07-31: 100 mg via INTRAVENOUS

## 2016-07-31 MED ORDER — ENOXAPARIN SODIUM 60 MG/0.6ML ~~LOC~~ SOLN
50.0000 mg | SUBCUTANEOUS | Status: DC
  Administered 2016-08-01 – 2016-08-03 (×3): 50 mg via SUBCUTANEOUS
  Filled 2016-07-31 (×4): qty 0.6

## 2016-07-31 MED ORDER — FENTANYL CITRATE (PF) 100 MCG/2ML IJ SOLN
INTRAMUSCULAR | Status: AC
  Filled 2016-07-31: qty 2

## 2016-07-31 MED ORDER — PIPERACILLIN-TAZOBACTAM 3.375 G IVPB 30 MIN
3.3750 g | Freq: Once | INTRAVENOUS | Status: AC
  Administered 2016-07-31: 3.375 g via INTRAVENOUS
  Filled 2016-07-31: qty 50

## 2016-07-31 MED ORDER — CEFAZOLIN SODIUM-DEXTROSE 2-4 GM/100ML-% IV SOLN
2.0000 g | Freq: Three times a day (TID) | INTRAVENOUS | Status: DC
  Administered 2016-07-31: 2 g via INTRAVENOUS
  Filled 2016-07-31 (×3): qty 100

## 2016-07-31 MED ORDER — FENTANYL CITRATE (PF) 100 MCG/2ML IJ SOLN
INTRAMUSCULAR | Status: DC | PRN
  Administered 2016-07-31 (×4): 50 ug via INTRAVENOUS

## 2016-07-31 MED ORDER — MEPERIDINE HCL 50 MG/ML IJ SOLN: 6.2500 mg | INTRAMUSCULAR | Status: DC | PRN

## 2016-07-31 MED ORDER — ONDANSETRON HCL 4 MG/2ML IJ SOLN
INTRAMUSCULAR | Status: DC | PRN
  Administered 2016-07-31: 4 mg via INTRAVENOUS

## 2016-07-31 MED ORDER — PROPOFOL 10 MG/ML IV BOLUS
INTRAVENOUS | Status: DC | PRN
  Administered 2016-07-31: 180 mg via INTRAVENOUS

## 2016-07-31 MED ORDER — ENOXAPARIN SODIUM 60 MG/0.6ML ~~LOC~~ SOLN: 50.0000 mg | SUBCUTANEOUS | Status: DC

## 2016-07-31 MED ORDER — ONDANSETRON 4 MG PO TBDP
4.0000 mg | ORAL_TABLET | Freq: Once | ORAL | Status: AC | PRN
  Administered 2016-07-31: 4 mg via ORAL
  Filled 2016-07-31: qty 1

## 2016-07-31 MED ORDER — PROMETHAZINE HCL 25 MG/ML IJ SOLN: 6.2500 mg | INTRAMUSCULAR | Status: DC | PRN

## 2016-07-31 MED ORDER — PIPERACILLIN-TAZOBACTAM 3.375 G IVPB: 3.3750 g | Freq: Three times a day (TID) | INTRAVENOUS | Status: DC

## 2016-07-31 MED ORDER — LIDOCAINE HCL (CARDIAC) 20 MG/ML IV SOLN
INTRAVENOUS | Status: DC | PRN
  Administered 2016-07-31: 100 mg via INTRAVENOUS

## 2016-07-31 MED ORDER — METOCLOPRAMIDE HCL 5 MG/ML IJ SOLN
INTRAMUSCULAR | Status: DC | PRN
  Administered 2016-07-31: 5 mg via INTRAVENOUS

## 2016-07-31 MED ORDER — ONDANSETRON HCL 4 MG/2ML IJ SOLN
INTRAMUSCULAR | Status: AC
  Filled 2016-07-31: qty 2

## 2016-07-31 MED ORDER — INSULIN ASPART 100 UNIT/ML ~~LOC~~ SOLN
7.0000 [IU] | Freq: Once | SUBCUTANEOUS | Status: AC
Start: 2016-07-31 — End: 2016-07-31
  Administered 2016-07-31: 7 [IU] via SUBCUTANEOUS

## 2016-07-31 MED ORDER — DEXAMETHASONE SODIUM PHOSPHATE 10 MG/ML IJ SOLN
INTRAMUSCULAR | Status: AC
Start: 2016-07-31 — End: 2016-07-31
  Filled 2016-07-31: qty 1

## 2016-07-31 MED ORDER — ENOXAPARIN SODIUM 60 MG/0.6ML ~~LOC~~ SOLN
50.0000 mg | SUBCUTANEOUS | Status: DC
  Administered 2016-07-31: 50 mg via SUBCUTANEOUS
  Filled 2016-07-31: qty 0.6

## 2016-07-31 MED ORDER — MIDAZOLAM HCL 2 MG/2ML IJ SOLN
INTRAMUSCULAR | Status: AC
  Filled 2016-07-31: qty 2

## 2016-07-31 MED ORDER — LACTATED RINGERS IV SOLN
INTRAVENOUS | Status: DC
  Administered 2016-07-31: 1000 mL via INTRAVENOUS

## 2016-07-31 MED ORDER — FENTANYL CITRATE (PF) 100 MCG/2ML IJ SOLN
50.0000 ug | INTRAMUSCULAR | Status: AC | PRN
  Administered 2016-07-31 (×2): 50 ug via NASAL
  Filled 2016-07-31 (×2): qty 2

## 2016-07-31 MED ORDER — VANCOMYCIN HCL IN DEXTROSE 1-5 GM/200ML-% IV SOLN
1000.0000 mg | Freq: Once | INTRAVENOUS | Status: AC
  Administered 2016-07-31: 1000 mg via INTRAVENOUS
  Filled 2016-07-31: qty 200

## 2016-07-31 MED ORDER — INSULIN ASPART 100 UNIT/ML ~~LOC~~ SOLN
SUBCUTANEOUS | Status: AC
  Administered 2016-07-31: 7 [IU] via SUBCUTANEOUS
  Filled 2016-07-31: qty 1

## 2016-07-31 MED ORDER — ONDANSETRON HCL 4 MG/2ML IJ SOLN: 4.0000 mg | Freq: Three times a day (TID) | INTRAMUSCULAR | Status: DC | PRN

## 2016-07-31 MED ORDER — FENTANYL CITRATE (PF) 100 MCG/2ML IJ SOLN: 50.0000 ug | INTRAMUSCULAR | Status: DC | PRN

## 2016-07-31 MED ORDER — PROPOFOL 10 MG/ML IV BOLUS
INTRAVENOUS | Status: AC
  Filled 2016-07-31: qty 20

## 2016-07-31 MED ORDER — VANCOMYCIN HCL IN DEXTROSE 1-5 GM/200ML-% IV SOLN
1000.0000 mg | Freq: Three times a day (TID) | INTRAVENOUS | Status: DC
  Administered 2016-07-31 – 2016-08-02 (×6): 1000 mg via INTRAVENOUS
  Filled 2016-07-31 (×6): qty 200

## 2016-07-31 MED ORDER — HYDROMORPHONE HCL 1 MG/ML IJ SOLN
INTRAMUSCULAR | Status: AC
  Filled 2016-07-31: qty 1

## 2016-07-31 MED ORDER — HYDROMORPHONE HCL 1 MG/ML IJ SOLN
0.2500 mg | INTRAMUSCULAR | Status: DC | PRN
  Administered 2016-07-31 (×2): 0.5 mg via INTRAVENOUS

## 2016-07-31 SURGICAL SUPPLY — 29 items
BLADE SURG SZ11 CARB STEEL (BLADE) IMPLANT
BNDG GAUZE ELAST 4 BULKY (GAUZE/BANDAGES/DRESSINGS) ×2 IMPLANT
CHLORAPREP W/TINT 26ML (MISCELLANEOUS) ×2 IMPLANT
COVER SURGICAL LIGHT HANDLE (MISCELLANEOUS) ×2 IMPLANT
DECANTER SPIKE VIAL GLASS SM (MISCELLANEOUS) IMPLANT
DRAIN PENROSE 18X1/4 LTX STRL (WOUND CARE) ×2 IMPLANT
DRAPE LAPAROSCOPIC ABDOMINAL (DRAPES) IMPLANT
DRAPE LAPAROTOMY T 98X78 PEDS (DRAPES) ×2 IMPLANT
DRAPE LAPAROTOMY TRNSV 102X78 (DRAPE) IMPLANT
DRSG PAD ABDOMINAL 8X10 ST (GAUZE/BANDAGES/DRESSINGS) IMPLANT
ELECT REM PT RETURN 9FT ADLT (ELECTROSURGICAL) ×2
ELECTRODE REM PT RTRN 9FT ADLT (ELECTROSURGICAL) ×1 IMPLANT
GAUZE SPONGE 4X4 12PLY STRL (GAUZE/BANDAGES/DRESSINGS) IMPLANT
GLOVE BIOGEL PI IND STRL 7.0 (GLOVE) IMPLANT
GLOVE BIOGEL PI INDICATOR 7.0 (GLOVE)
GLOVE SURG SS PI 7.0 STRL IVOR (GLOVE) ×2 IMPLANT
GOWN STRL REUS W/TWL LRG LVL3 (GOWN DISPOSABLE) IMPLANT
GOWN STRL REUS W/TWL XL LVL3 (GOWN DISPOSABLE) ×2 IMPLANT
KIT BASIN OR (CUSTOM PROCEDURE TRAY) ×2 IMPLANT
LUBRICANT JELLY K Y 4OZ (MISCELLANEOUS) IMPLANT
PACK GENERAL/GYN (CUSTOM PROCEDURE TRAY) ×2 IMPLANT
PAD ABD 8X10 STRL (GAUZE/BANDAGES/DRESSINGS) ×2 IMPLANT
SPONGE LAP 18X18 X RAY DECT (DISPOSABLE) ×2 IMPLANT
SUT ETHILON 2 0 PS N (SUTURE) ×2 IMPLANT
SUT MNCRL AB 4-0 PS2 18 (SUTURE) IMPLANT
SWAB COLLECTION DEVICE MRSA (MISCELLANEOUS) IMPLANT
SWAB CULTURE ESWAB REG 1ML (MISCELLANEOUS) ×2 IMPLANT
TOWEL OR 17X26 10 PK STRL BLUE (TOWEL DISPOSABLE) ×2 IMPLANT
TOWEL OR NON WOVEN STRL DISP B (DISPOSABLE) ×2 IMPLANT

## 2016-07-31 NOTE — H&P (Signed)
[facility] H&P    Patient Demographics:    Elizabeth Blackburn, is a 25 y.o. female  MRN: [id number]  DOB - 07-19-1991  Admit Date - 07/31/2016  Referring MD/NP/PA: Dr. Preston Fleeting  Outpatient Primary MD for the patient is WEBB, Estill Batten, MD  Patient coming from: Home  Chief Complaint  Patient presents with  . Breast Pain      HPI:    Elizabeth Blackburn  is a 25 y.o. female, With history of non-insulin-dependent diabetes mellitus who came to the ED for progressive worsening of right breast pain. Patient was seen on 07/25/2016 at that time she was seen by surgery and 1.5 mL of pus was aspirated and patient was discharged on Bactrim. Initially pain began on 07/22/2016 after having bilateral nipple piercing on 07/15/2016.  Patient denies any fever or chills, but continue to have right breast pain. Denies nausea vomiting or diarrhea. Denies chest pain or shortness of breath. Denies dysuria    Review of systems:    In addition to the HPI above,  No Fever-chills, No Headache, No changes with Vision or hearing, No problems swallowing food or Liquids, No Blood in stool or Urine, No dysuria, No new skin rashes or bruises, No new joints pains-aches,  No new weakness, tingling, numbness in any extremity, No recent weight gain or loss, No polyuria, polydypsia or polyphagia, No significant Mental Stressors.  A full 10 point Review of Systems was done, except as stated above, all other Review of Systems were negative.   With Past History of the following :    Past Medical History:  Diagnosis Date  . Diabetes mellitus without complication (HCC)       History reviewed. No pertinent surgical history.    Social History:      Social History  Substance Use Topics  . Smoking status: Never Smoker  . Smokeless tobacco: Never Used  . Alcohol use Yes       Family History :     Home Medications:   Prior to Admission  medications   Medication Sig Start Date End Date Taking? Authorizing Provider  ibuprofen (ADVIL,MOTRIN) 200 MG tablet Take 400 mg by mouth every 6 (six) hours as needed for headache.   Yes Historical Provider, MD  naproxen (NAPROSYN) 500 MG tablet Take 1 tablet (500 mg total) by mouth 2 (two) times daily. For mild/moderate pain 06/20/16  Yes Antony Madura, PA-C  sulfamethoxazole-trimethoprim (BACTRIM DS,SEPTRA DS) 800-160 MG tablet Take 1 tablet by mouth 2 (two) times daily. 07/25/16 08/01/16 Yes Jaime Pilcher Ward, PA-C  CHLORHEXIDINE GLUCONATE, BULK, SOLN Apply while bathing/showering.  Cover skin and cleanse/scrub gently.  Rinse off.  Use until gone. Patient not taking: Reported on 07/31/2016 05/04/16   Roxy Horseman, PA-C  clindamycin (CLEOCIN) 150 MG capsule Take 2 capsules (300 mg total) by mouth 3 (three) times daily. May dispense as 150mg  capsules Patient not taking: Reported on 07/31/2016 04/20/16   Arthor Captain, PA-C  doxycycline (VIBRAMYCIN) 100 MG capsule Take 1 capsule (100 mg total) by mouth 2 (two) times daily. Patient  not taking: Reported on 07/31/2016 06/13/16   Shawn C Joy, PA-C  ondansetron (ZOFRAN) 4 MG tablet Take 1 tablet (4 mg total) by mouth every 6 (six) hours. Patient not taking: Reported on 07/31/2016 04/21/16   Emi Holes, PA-C  oxyCODONE (OXY IR/ROXICODONE) 5 MG immediate release tablet Take 0.5-1 tablets (2.5-5 mg total) by mouth every 6 (six) hours as needed for severe pain. Patient not taking: Reported on 07/31/2016 04/20/16   Arthor Captain, PA-C     Allergies:     Allergies  Allergen Reactions  . Peanut-Containing Drug Products Rash  . Tylenol [Acetaminophen] Nausea And Vomiting and Rash    Rash on chest     Physical Exam:   Vitals  Blood pressure (!) 144/78, pulse (!) 124, temperature 98.9 F (37.2 C), temperature source Oral, resp. rate (!) 21, height 5\' 4"  (1.626 m), weight 105.3 kg (232 lb 2.3 oz), last menstrual period 05/25/2016, SpO2 96 %.  1.   General: Caucasian female in no acute distress  2. Psychiatric:  Intact judgement and  insight, awake alert, oriented x 3.  3. Neurologic: No focal neurological deficits, all cranial nerves intact.Strength 5/5 all 4 extremities, sensation intact all 4 extremities, plantars down going.  4. Eyes :  anicteric sclerae, moist conjunctivae with no lid lag. PERRLA.  5. ENMT:  Oropharynx clear with moist mucous membranes and good dentition  6. Neck:  supple, no cervical lymphadenopathy appriciated, No thyromegaly  7. Respiratory : Normal respiratory effort, good air movement bilaterally,clear to  auscultation bilaterally  8. Cardiovascular : RRR, no gallops, rubs or murmurs, no leg edema  9. Gastrointestinal:  Positive bowel sounds, abdomen soft, non-tender to palpation,no hepatosplenomegaly, no rigidity or guarding       10. Skin: Marked  erythema, induration noted on the medial aspect of right breast, tender to palpation ( examined along with nurse at bedside)       Data Review:    CBC  Recent Labs Lab 07/25/16 0412 07/25/16 0452 07/31/16 0313  WBC 17.0*  --  22.0*  HGB 13.3 14.6 11.4*  HCT 39.3 43.0 34.5*  PLT 427*  --  377  MCV 80.4  --  80.2  MCH 27.2  --  26.5  MCHC 33.8  --  33.0  RDW 13.7  --  13.8  LYMPHSABS 2.7  --  2.4  MONOABS 1.3*  --  1.8*  EOSABS 0.1  --  0.2  BASOSABS 0.0  --  0.0   ------------------------------------------------------------------------------------------------------------------  Chemistries   Recent Labs Lab 07/25/16 0452 07/31/16 0313  NA 134* 131*  K 4.0 3.9  CL 94* 94*  CO2  --  26  GLUCOSE 492* 444*  BUN 3* 9  CREATININE 0.30* 0.53  CALCIUM  --  9.0   ------------------------------------------------------------------------------------------------------------------  ------------------------------------------------------------------------------------------------------------------ CBG:  Recent Labs Lab  07/25/16 0700 07/31/16 0325  GLUCAP 365* 417*   Lipid Profile: No results for input(s): CHOL, HDL, LDLCALC, TRIG, CHOLHDL, LDLDIRECT in the last 72 hours. Thyroid Function Tests: No results for input(s): TSH, T4TOTAL, FREET4, T3FREE, THYROIDAB in the last 72 hours. Anemia Panel: No results for input(s): VITAMINB12, FOLATE, FERRITIN, TIBC, IRON, RETICCTPCT in the last 72 hours.  --------------------------------------------------------------------------------------------------------------- Urine analysis:    Component Value Date/Time   COLORURINE YELLOW 07/31/2016 0405   APPEARANCEUR CLEAR 07/31/2016 0405   LABSPEC >1.046 (H) 07/31/2016 0405   PHURINE 5.5 07/31/2016 0405   GLUCOSEU >1000 (A) 07/31/2016 0405   HGBUR NEGATIVE 07/31/2016 0405   BILIRUBINUR NEGATIVE  07/31/2016 0405   KETONESUR 40 (A) 07/31/2016 0405   PROTEINUR NEGATIVE 07/31/2016 0405   UROBILINOGEN 0.2 04/04/2014 2215   NITRITE NEGATIVE 07/31/2016 0405   LEUKOCYTESUR NEGATIVE 07/31/2016 0405      Imaging Results:    No results found.  My personal review of EKG: Rhythm NSR   Assessment & Plan:    Active Problems:   Cellulitis   Breast abscess 1. Right breast abscess/sepsis- patient presented with leukocytosis, tachycardia cultures from the aspirate obtained on 07/25/2016 showed Escherichia coli, Klebsiella, MRSA. Patient started on vancomycin, cefazolin. Gen. surgery consulted by the ED physician, Dr. Gerrit FriendsGerkin to see the patient for incision and drainage 2. Diabetes mellitus- hold oral hypoglycemic agents, we'll start sliding scale insulin with NovoLog    DVT Prophylaxis-   Lovenox   AM Labs Ordered, also please review Full Orders  Family Communication: Admission, patients condition and plan of care including tests being ordered have been discussed with the patient and her mother at bedside who indicate understanding and agree with the plan and Code Status.  Code Status:  Full code  Admission  observation  Time spent in minutes : 60 minutes   Margurite Duffy S M.D on 07/31/2016 at 8:18 AM  Between 7am to 7pm - Pager - [phone]. After 7pm go to [email or web address] - password [email or web address]  [facility] - Office  [phone]

## 2016-07-31 NOTE — ED Triage Notes (Signed)
Pt had nipples pierced on 9/22 and came in for infection evaluation of R breast on 10/4. Pt was given antibiotics and told to f/u with the breast center but they would not see her. Breast is now more swollen and red. Came in tonight because of the pain in her chest. Alert and oriented.

## 2016-07-31 NOTE — Anesthesia Procedure Notes (Signed)
Procedure Name: Intubation Date/Time: 07/31/2016 4:07 PM Performed by: Elizabeth Blackburn, Elizabeth Blackburn Pre-anesthesia Checklist: Patient identified, Emergency Drugs available, Suction available, Patient being monitored and Timeout performed Patient Re-evaluated:Patient Re-evaluated prior to inductionOxygen Delivery Method: Circle system utilized Preoxygenation: Pre-oxygenation with 100% oxygen Intubation Type: IV induction Ventilation: Mask ventilation without difficulty Laryngoscope Size: Mac and 4 Grade View: Grade I Tube type: Oral Tube size: 7.5 mm Number of attempts: 1 Airway Equipment and Method: Stylet Placement Confirmation: ETT inserted through vocal cords under direct vision,  positive ETCO2,  CO2 detector and breath sounds checked- equal and bilateral Secured at: 22 cm Tube secured with: Tape Dental Injury: Teeth and Oropharynx as per pre-operative assessment

## 2016-07-31 NOTE — Telephone Encounter (Signed)
Post ED Visit - Positive Culture Follow-up  Culture report reviewed by antimicrobial stewardship pharmacist:  []  Enzo BiNathan Batchelder, Pharm.D. []  Celedonio MiyamotoJeremy Frens, Pharm.D., BCPS [x]  Garvin FilaMike Maccia, Pharm.D. []  Georgina PillionElizabeth Martin, Pharm.D., BCPS []  DepewMinh Pham, [address] Rainbow BoulevardPharm.D., BCPS, AAHIVP []  Estella HuskMichelle Turner, Pharm.D., BCPS, AAHIVP []  Tennis Mustassie Stewart, Pharm.D. []  Rob Oswaldo DoneVincent, Vermont[place]arm.D.  Positive wound culture Treated with bactrim DS, organism sensitive to the same and no further patient follow-up is required at this time.  Berle MullMiller, December Hedtke 07/31/2016, 9:49 AM

## 2016-07-31 NOTE — ED Notes (Signed)
TWO SPERATE I-STAT BETA'S WERE RUN BOTH CAME BACK CARTRIDGE ERROR USE ANOTHER CARTRIDGE.

## 2016-07-31 NOTE — Anesthesia Postprocedure Evaluation (Signed)
Anesthesia Post Note  Patient: Elizabeth Blackburn  Procedure(s) Performed: Procedure(s) (LRB): IRRIGATION AND DEBRIDEMENT RIGHT BREAST ABSCESS (Right)  Patient location during evaluation: PACU Anesthesia Type: General Level of consciousness: sedated and patient cooperative Pain management: pain level controlled Vital Signs Assessment: post-procedure vital signs reviewed and stable Respiratory status: spontaneous breathing Cardiovascular status: stable Anesthetic complications: no    Last Vitals:  Vitals:   07/31/16 1730 07/31/16 1743  BP: 130/80 120/87  Pulse: (!) 107 (!) 114  Resp: 13 17  Temp: 37.2 C 37.3 C    Last Pain:  Vitals:   07/31/16 1730  TempSrc:   PainSc: Asleep                 Lewie LoronJohn Rhythm Gubbels

## 2016-07-31 NOTE — Op Note (Signed)
Operative Note  Altamese CabalKelly Schillo 25 y.o. female [id number]  07/31/2016  Surgeon: Berna Buehelsea A Mystery Schrupp   Assistant: none  Procedure performed: Incision, drainage and debridement of right medial breast abscess  Preop diagnosis: breast abscess  Post-op diagnosis/intraop findings: same  Specimens: cultures were obtained  EBL: minimal  Complications: none  Description of procedure: After obtaining informed consent the patient was brought to the operating room. Antibiotics and chemical DVT prophylaxis had been administered. SCD's were applied. General endotracheal anesthesia was initiated and a formal time-out was performed. The right breast and chest were prepped and draped in the usual sterile fashion. A 4 cm incision was made radially at the inferior medial aspect of the right breast over the area of most fluctuance. A large volume of purulent drainage was immediately expressed, approximately 200mL. the wound was probed digitally and all loculations were broken up using finger dissection. This left a rather large cavity in the medial aspect of the breast extending underneath the areola; of note. Drainage was able to be expressed from the areola prior to incision suggesting that this communicated with the ductal system. Once all purulent fluid had been expressed a small amount of necrotic fat tissue was debrided. A counterincision was made in the superior aspect of the breast and a Penrose drain was introduced through the wound and sutured to itself with 2-0 nylon's. The abscess cavity was irrigated copiously with warm sterile saline. Hemostasis was confirmed. The wound was then packed with a saline moistened Kerlix. A dry dressing was then applied. The patient was then awakened excreted and taken to PACU in stable condition.   All counts were correct at the completion of the case.

## 2016-07-31 NOTE — Progress Notes (Signed)
Pt resting without distress. Dressing to right chest dry and intact. Due to void. Arouses easily--will continue to monitor. SRP, RN.

## 2016-07-31 NOTE — Consult Note (Signed)
[facility] Consult Note  Elizabeth Blackburn 02/20/1991  [id number].    Requesting MD: Roxanne Mins Chief Complaint/Reason for Consult: Breast abscess  HPI:  Elizabeth Blackburn is a 25yo female with PMH non-insulin-dependent DM who presented to [facility] earlier this morning with progressively worsening right breast pain. The pain began 07/22/2016 after having bilateral nipple piercing on 07/15/16. Seen in ED 07/25/16 by general surgery for right breast abscess, 1.31m of pus were aspirated and she was discharged home on bactrim. States that she took this medication as prescribed but the swelling and erythema increased. She also reports purulent drainage from the nipple until 07/28/16 when she was able to get the piercing out. She also reports associated subjective fevers, nausea, and vomiting. She has tried warm compresses but this did not help.  Hospital workup: - Right breast u/s showed large amounts of cellulitis with cobblestoning. It appears as if there is a deep abscess as well. - WBC 22.0, tachycardic 124 - lactic acid 1.28, afebrile - blood culture pending - started on ancef and vancomycin  PMH significant for non-insulin-dependent DM, h/o several boils (typically on her back) requiring I&D Denies home use of anticoagulants Nonsmoker Employment: [employer]  ROS: All systems reviewed and otherwise negative except for as above  History reviewed. No pertinent family history.  Past Medical History:  Diagnosis Date  . Diabetes mellitus without complication ([place]     History reviewed. No pertinent surgical history.  Social History:  reports that she has never smoked. She has never used smokeless tobacco. She reports that she drinks alcohol. She reports that she does not use drugs.  Allergies:  Allergies  Allergen Reactions  . Peanut-Containing Drug Products Rash  . Tylenol [Acetaminophen] Nausea And Vomiting and Rash    Rash on chest    Medications Prior to Admission  Medication Sig  Dispense Refill  . ibuprofen (ADVIL,MOTRIN) 200 MG tablet Take 400 mg by mouth every 6 (six) hours as needed for headache.    . naproxen (NAPROSYN) 500 MG tablet Take 1 tablet (500 mg total) by mouth 2 (two) times daily. For mild/moderate pain 30 tablet 0  . sulfamethoxazole-trimethoprim (BACTRIM DS,SEPTRA DS) 800-160 MG tablet Take 1 tablet by mouth 2 (two) times daily. 14 tablet 0  . CHLORHEXIDINE GLUCONATE, BULK, SOLN Apply while bathing/showering.  Cover skin and cleanse/scrub gently.  Rinse off.  Use until gone. (Patient not taking: Reported on 07/31/2016) 500 mL 1  . clindamycin (CLEOCIN) 150 MG capsule Take 2 capsules (300 mg total) by mouth 3 (three) times daily. May dispense as 1535mcapsules (Patient not taking: Reported on 07/31/2016) 60 capsule 0  . doxycycline (VIBRAMYCIN) 100 MG capsule Take 1 capsule (100 mg total) by mouth 2 (two) times daily. (Patient not taking: Reported on 07/31/2016) 20 capsule 0  . ondansetron (ZOFRAN) 4 MG tablet Take 1 tablet (4 mg total) by mouth every 6 (six) hours. (Patient not taking: Reported on 07/31/2016) 12 tablet 0  . oxyCODONE (OXY IR/ROXICODONE) 5 MG immediate release tablet Take 0.5-1 tablets (2.5-5 mg total) by mouth every 6 (six) hours as needed for severe pain. (Patient not taking: Reported on 07/31/2016) 10 tablet 0    Blood pressure (!) 144/78, pulse (!) 124, temperature 98.9 F (37.2 C), temperature source Oral, resp. rate (!) 21, height _0  (1.626 m), weight 232 lb 2.3 oz (105.3 kg), last menstrual period 05/25/2016, SpO2 96 %. Physical Exam: General: pleasant, WD/WN female who is laying in bed in uncomfortable HEENT: head is normocephalic, atraumatic.  Mouth is pink and moist Heart: regular, rate, and rhythm.  Palpable pedal pulses bilaterally Lungs: Respiratory effort nonlabored Abd: soft, NT/ND, no masses, hernias, or organomegaly MS: all 4 extremities are symmetrical with no cyanosis, clubbing, or edema. Psych: A&Ox3 with an  appropriate affect. Right breast: pictured below, area is firm and very tender, no active drainage     Results for orders placed or performed during the hospital encounter of 07/31/16 (from the past 48 hour(s))  CBC with Differential     Status: Abnormal   Collection Time: 07/31/16  3:13 AM  Result Value Ref Range   WBC 22.0 (H) 4.0 - 10.5 K/uL   RBC 4.30 3.87 - 5.11 MIL/uL   Hemoglobin 11.4 (L) 12.0 - 15.0 g/dL   HCT 34.5 (L) 36.0 - 46.0 %   MCV 80.2 78.0 - 100.0 fL   MCH 26.5 26.0 - 34.0 pg   MCHC 33.0 30.0 - 36.0 g/dL   RDW 13.8 11.5 - 15.5 %   Platelets 377 150 - 400 K/uL   Neutrophils Relative % 80 %   Neutro Abs 17.7 (H) 1.7 - 7.7 K/uL   Lymphocytes Relative 11 %   Lymphs Abs 2.4 0.7 - 4.0 K/uL   Monocytes Relative 8 %   Monocytes Absolute 1.8 (H) 0.1 - 1.0 K/uL   Eosinophils Relative 1 %   Eosinophils Absolute 0.2 0.0 - 0.7 K/uL   Basophils Relative 0 %   Basophils Absolute 0.0 0.0 - 0.1 K/uL  Basic metabolic panel     Status: Abnormal   Collection Time: 07/31/16  3:13 AM  Result Value Ref Range   Sodium 131 (L) 135 - 145 mmol/L   Potassium 3.9 3.5 - 5.1 mmol/L   Chloride 94 (L) 101 - 111 mmol/L   CO2 26 22 - 32 mmol/L   Glucose, Bld 444 (H) 65 - 99 mg/dL   BUN 9 6 - 20 mg/dL   Creatinine, Ser 0.53 0.44 - 1.00 mg/dL   Calcium 9.0 8.9 - 10.3 mg/dL   GFR calc non Af Amer >60 >60 mL/min   GFR calc Af Amer >60 >60 mL/min    Comment: (NOTE) The eGFR has been calculated using the CKD EPI equation. This calculation has not been validated in all clinical situations. eGFR's persistently <60 mL/min signify possible Chronic Kidney Disease.    Anion gap 11 5 - 15  POC CBG, ED     Status: Abnormal   Collection Time: 07/31/16  3:25 AM  Result Value Ref Range   Glucose-Capillary 417 (H) 65 - 99 mg/dL  I-Stat CG4 Lactic Acid, ED     Status: None   Collection Time: 07/31/16  3:30 AM  Result Value Ref Range   Lactic Acid, Venous 1.28 0.5 - 1.9 mmol/L  Urinalysis, Routine  w reflex microscopic (not at [facility])     Status: Abnormal   Collection Time: 07/31/16  4:05 AM  Result Value Ref Range   Color, Urine YELLOW YELLOW   APPearance CLEAR CLEAR   Specific Gravity, Urine >1.046 (H) 1.005 - 1.030   pH 5.5 5.0 - 8.0   Glucose, UA >1000 (A) NEGATIVE mg/dL   Hgb urine dipstick NEGATIVE NEGATIVE   Bilirubin Urine NEGATIVE NEGATIVE   Ketones, ur 40 (A) NEGATIVE mg/dL   Protein, ur NEGATIVE NEGATIVE mg/dL   Nitrite NEGATIVE NEGATIVE   Leukocytes, UA NEGATIVE NEGATIVE  Urine microscopic-add on     Status: Abnormal   Collection Time: 07/31/16  4:05 AM  Result Value Ref Range   Squamous Epithelial / LPF 6-30 (A) NONE SEEN   WBC, UA 0-5 0 - 5 WBC/hpf   RBC / HPF 0-5 0 - 5 RBC/hpf   Bacteria, UA FEW (A) NONE SEEN   Urine-Other YEAST PRESENT   POC urine preg, ED (not at [facility])     Status: None   Collection Time: 07/31/16  4:09 AM  Result Value Ref Range   Preg Test, Ur NEGATIVE NEGATIVE    Comment:        THE SENSITIVITY OF THIS METHODOLOGY IS >24 mIU/mL    No results found.    Assessment/Plan Right breast abscess - needle aspiration 07/25/16 in ED by general surgery and discharged on bactrim; continued to have pain, erythema, edema, and purulent drainage therefore returned to ED today - Right breast u/s showed large amounts of cellulitis with cobblestoning. It appears as if there is a deep abscess as well. - WBC 22.0, tachycardic 124 - lactic acid 1.28, afebrile - blood culture pending  Leukocytosis - 22 Hyponatremia - 131 Hyperglycemia - 417  ID - ancef and vancomycin 10/10>>  Plan -  Continue antibiotics, warm compresses. Will discuss I&D with MD.  Jerrye Beavers, [facility] Surgery 07/31/2016, 7:42 AM Pager: [phone] Consults: [phone] Mon-Fri 7:00 am-4:30 pm Sat-Sun 7:00 am-11:30 am

## 2016-07-31 NOTE — Consult Note (Signed)
Chief Complaint: right breast abscess  Referring Physician:Dr. Romana Juniper  Supervising Physician: Sandi Mariscal  Patient Status: In-pt   HPI: Elizabeth Blackburn is an 25 y.o. female who recently had her nipples pierced.  Since that time she developed a right breast abscess.  This was aspirated by the general surgeons on 10/4 and 1.48ms were removed.  She was treated with Bactrim.  She presented back to the ED today due to worsening pain.  She denies fevers at home.  Upon arrival she was noted to have a large right breast abscess with a WBC of 22K and a pulse in the 120s.  We have been asked to evaluate her for a possible aspiration.  Past Medical History:  Past Medical History:  Diagnosis Date  . Diabetes mellitus without complication ([facility]     Past Surgical History: History reviewed. No pertinent surgical history.  Family History: History reviewed. No pertinent family history.  Social History:  reports that she has never smoked. She has never used smokeless tobacco. She reports that she drinks alcohol. She reports that she does not use drugs.  Allergies:  Allergies  Allergen Reactions  . Peanut-Containing Drug Products Rash  . Tylenol [Acetaminophen] Nausea And Vomiting and Rash    Rash on chest    Medications: Medications reviewed in Epic, just received Lovenox 562mat 1000am  Please HPI for pertinent positives, otherwise complete 10 system ROS negative.  Physical Exam: BP (!) 144/78 (BP Location: Right Arm)   Pulse (!) 124   Temp 98.9 F (37.2 C) (Oral)   Resp (!) 21   Ht 5' 4"  (1.626 m)   Wt 232 lb 2.3 oz (105.3 kg)   LMP 05/25/2016   SpO2 96%   BMI 39.85 kg/m  Body mass index is 39.85 kg/m. General: pleasant, WD, WN black female who is laying in bed in mild distress secondary to pain. HEENT: head is normocephalic, atraumatic.  Sclera are noninjected.  PERRL.  Ears and nose without any masses or lesions.  Mouth is pink and moist Heart: regular, rate, and  rhythm.  Normal s1,s2. No obvious murmurs, gallops, or rubs noted.  Palpable radial and pedal pulses bilaterally Lungs: CTAB, no wheezes, rhonchi, or rales noted.  Respiratory effort nonlabored Breast: right medial breast with a 12x12cm area of erythema.  This is indurated and incredibly painful.  Unable to determine if purulent drainage from nipple can be expressed as she is too tender to palpate. Abd: soft, NT, ND, +BS, no masses, hernias, or organomegaly Psych: A&Ox3 with an appropriate affect.   Labs: Results for orders placed or performed during the hospital encounter of 07/31/16 (from the past 48 hour(s))  CBC with Differential     Status: Abnormal   Collection Time: 07/31/16  3:13 AM  Result Value Ref Range   WBC 22.0 (H) 4.0 - 10.5 K/uL   RBC 4.30 3.87 - 5.11 MIL/uL   Hemoglobin 11.4 (L) 12.0 - 15.0 g/dL   HCT 34.5 (L) 36.0 - 46.0 %   MCV 80.2 78.0 - 100.0 fL   MCH 26.5 26.0 - 34.0 pg   MCHC 33.0 30.0 - 36.0 g/dL   RDW 13.8 11.5 - 15.5 %   Platelets 377 150 - 400 K/uL   Neutrophils Relative % 80 %   Neutro Abs 17.7 (H) 1.7 - 7.7 K/uL   Lymphocytes Relative 11 %   Lymphs Abs 2.4 0.7 - 4.0 K/uL   Monocytes Relative 8 %   Monocytes Absolute 1.8 (  H) 0.1 - 1.0 K/uL   Eosinophils Relative 1 %   Eosinophils Absolute 0.2 0.0 - 0.7 K/uL   Basophils Relative 0 %   Basophils Absolute 0.0 0.0 - 0.1 K/uL  Basic metabolic panel     Status: Abnormal   Collection Time: 07/31/16  3:13 AM  Result Value Ref Range   Sodium 131 (L) 135 - 145 mmol/L   Potassium 3.9 3.5 - 5.1 mmol/L   Chloride 94 (L) 101 - 111 mmol/L   CO2 26 22 - 32 mmol/L   Glucose, Bld 444 (H) 65 - 99 mg/dL   BUN 9 6 - 20 mg/dL   Creatinine, Ser 0.53 0.44 - 1.00 mg/dL   Calcium 9.0 8.9 - 10.3 mg/dL   GFR calc non Af Amer >60 >60 mL/min   GFR calc Af Amer >60 >60 mL/min    Comment: (NOTE) The eGFR has been calculated using the CKD EPI equation. This calculation has not been validated in all clinical  situations. eGFR's persistently <60 mL/min signify possible Chronic Kidney Disease.    Anion gap 11 5 - 15  POC CBG, ED     Status: Abnormal   Collection Time: 07/31/16  3:25 AM  Result Value Ref Range   Glucose-Capillary 417 (H) 65 - 99 mg/dL  I-Stat CG4 Lactic Acid, ED     Status: None   Collection Time: 07/31/16  3:30 AM  Result Value Ref Range   Lactic Acid, Venous 1.28 0.5 - 1.9 mmol/L  Urinalysis, Routine w reflex microscopic (not at [facility])     Status: Abnormal   Collection Time: 07/31/16  4:05 AM  Result Value Ref Range   Color, Urine YELLOW YELLOW   APPearance CLEAR CLEAR   Specific Gravity, Urine >1.046 (H) 1.005 - 1.030   pH 5.5 5.0 - 8.0   Glucose, UA >1000 (A) NEGATIVE mg/dL   Hgb urine dipstick NEGATIVE NEGATIVE   Bilirubin Urine NEGATIVE NEGATIVE   Ketones, ur 40 (A) NEGATIVE mg/dL   Protein, ur NEGATIVE NEGATIVE mg/dL   Nitrite NEGATIVE NEGATIVE   Leukocytes, UA NEGATIVE NEGATIVE  Urine microscopic-add on     Status: Abnormal   Collection Time: 07/31/16  4:05 AM  Result Value Ref Range   Squamous Epithelial / LPF 6-30 (A) NONE SEEN   WBC, UA 0-5 0 - 5 WBC/hpf   RBC / HPF 0-5 0 - 5 RBC/hpf   Bacteria, UA FEW (A) NONE SEEN   Urine-Other YEAST PRESENT   POC urine preg, ED (not at [facility])     Status: None   Collection Time: 07/31/16  4:09 AM  Result Value Ref Range   Preg Test, Ur NEGATIVE NEGATIVE    Comment:        THE SENSITIVITY OF THIS METHODOLOGY IS >24 mIU/mL   Glucose, capillary     Status: Abnormal   Collection Time: 07/31/16 10:16 AM  Result Value Ref Range   Glucose-Capillary 329 (H) 65 - 99 mg/dL   Comment 1 Notify RN   Protime-INR     Status: None   Collection Time: 07/31/16 12:06 PM  Result Value Ref Range   Prothrombin Time 14.1 11.4 - 15.2 seconds   INR 1.08   Glucose, capillary     Status: Abnormal   Collection Time: 07/31/16 12:17 PM  Result Value Ref Range   Glucose-Capillary 272 (H) 65 - 99 mg/dL    Imaging: No results  found.  Assessment/Plan 1. Right breast abscess -I have discussed this case  with Dr. Pascal Lux.  Unfortunately she got Lovenox at 1000am this morning.  She has not had any formal imaging.  I have spoken with Dr. Kae Heller who agrees that an [place] would be a good idea.  Then we can determine whether she needs an aspiration or if she will need to go to the OR due to the size. -the patient has now undergone her [place] and this area appears to be at least 10cm in size.  Dr. Pascal Lux personally, spoke to to Dr. Kae Heller.  After their discussion if was felt the patient would benefit most from a formal I&D in the OR.   -we will be available if needed  Thank you for this interesting consult.  I greatly enjoyed meeting Sabriya Yono and look forward to participating in their care.  A copy of this report was sent to the requesting provider on this date.  Electronically Signed: Henreitta Cea 07/31/2016, 2:18 PM   I spent a total of 40 Minutes in face to face in clinical consultation, greater than 50% of which was counseling/coordinating care for right breast abscess

## 2016-07-31 NOTE — Progress Notes (Addendum)
Pharmacy Antibiotic Note  Elizabeth Blackburn is a 25 y.o. female admitted on 07/31/2016 with cellulitis.  Pharmacy has been consulted for Ancef/Vancomycin dosing.  Plan: Ancef 2Gm IV q8h Vancomycin 1Gm q8h (VT=10-15)     Temp (24hrs), Avg:98.2 F (36.8 C), Min:98.2 F (36.8 C), Max:98.2 F (36.8 C)   Recent Labs Lab 07/25/16 0412 07/25/16 0452 07/31/16 0313 07/31/16 0330  WBC 17.0*  --  22.0*  --   CREATININE  --  0.30* 0.53  --   LATICACIDVEN  --   --   --  1.28    Estimated Creatinine Clearance: 127 mL/min (by C-G formula based on SCr of 0.53 mg/dL).    Allergies  Allergen Reactions  . Peanut-Containing Drug Products Rash  . Tylenol [Acetaminophen] Nausea And Vomiting and Rash    Rash on chest    Antimicrobials this admission: 10/10 zosyn >> 10/10 10/10 ancef >> 10/10 vancomycin >>   Dose adjustments this admission:   Microbiology results:  BCx:   UCx:    Sputum:    MRSA PCR:   Thank you for allowing pharmacy to be a part of this patient's care.  Lorenza EvangelistGreen, [place] R 07/31/2016 4:30 AM

## 2016-07-31 NOTE — ED Provider Notes (Signed)
[facility]-EMERGENCY DEPT Provider Note   CSN: [id number] Arrival date & time: 07/31/16  0037     History   Chief Complaint Chief Complaint  Patient presents with  . Breast Pain    HPI Elizabeth Blackburn is a 25 y.o. female with a hx of Non-insulin-dependent diabetes presents to the Emergency Department complaining of gradual, persistent, progressively worsening right pain onset 07/22/2016 after having bilateral nipples pierced on 07/15/16.  Per record review patient was evaluated by surgery and needle aspiration was performed. She was discharged home with Bactrim. His reports taking this medication as prescribed. She reports the swelling and redness have increased. She's had some subjective fevers at home but no measured fevers. Nausea and vomiting 48 hours ago. She reports the pain has continued to worsen. Patient has also tried warm compresses but nothing makes her symptoms better. Palpation makes them worse.   The history is provided by the patient and medical records. No language interpreter was used.     Past Medical History:  Diagnosis Date  . Diabetes mellitus without complication [facility](HCC)     Patient Active Problem List   Diagnosis Date Noted  . Cellulitis 07/31/2016    No past surgical history on file.  OB History    No data available       Home Medications    Prior to Admission medications   Medication Sig Start Date End Date Taking? Authorizing Provider  ibuprofen (ADVIL,MOTRIN) 200 MG tablet Take 400 mg by mouth every 6 (six) hours as needed for headache.   Yes Historical Provider, MD  naproxen (NAPROSYN) 500 MG tablet Take 1 tablet (500 mg total) by mouth 2 (two) times daily. For mild/moderate pain 06/20/16  Yes Antony MaduraKelly Humes, PA-C  sulfamethoxazole-trimethoprim (BACTRIM DS,SEPTRA DS) 800-160 MG tablet Take 1 tablet by mouth 2 (two) times daily. 07/25/16 08/01/16 Yes Jaime Pilcher Ward, PA-C  CHLORHEXIDINE GLUCONATE, BULK, SOLN Apply while bathing/showering.  Cover skin and  cleanse/scrub gently.  Rinse off.  Use until gone. Patient not taking: Reported on 07/31/2016 05/04/16   Roxy Horsemanobert Browning, PA-C  clindamycin (CLEOCIN) 150 MG capsule Take 2 capsules (300 mg total) by mouth 3 (three) times daily. May dispense as 150mg  capsules Patient not taking: Reported on 07/31/2016 04/20/16   Arthor CaptainAbigail Harris, PA-C  doxycycline (VIBRAMYCIN) 100 MG capsule Take 1 capsule (100 mg total) by mouth 2 (two) times daily. Patient not taking: Reported on 07/31/2016 06/13/16   Shawn C Joy, PA-C  ondansetron (ZOFRAN) 4 MG tablet Take 1 tablet (4 mg total) by mouth every 6 (six) hours. Patient not taking: Reported on 07/31/2016 04/21/16   Emi HolesAlexandra M Law, PA-C  oxyCODONE (OXY IR/ROXICODONE) 5 MG immediate release tablet Take 0.5-1 tablets (2.5-5 mg total) by mouth every 6 (six) hours as needed for severe pain. Patient not taking: Reported on 07/31/2016 04/20/16   Arthor CaptainAbigail Harris, PA-C    Family History No family history on file.  Social History Social History  Substance Use Topics  . Smoking status: Never Smoker  . Smokeless tobacco: Never Used  . Alcohol use Yes     Allergies   Peanut-containing drug products and Tylenol [acetaminophen]   Review of Systems Review of Systems  Constitutional: Positive for chills, fatigue and fever ( Subjective).  Gastrointestinal: Positive for nausea and vomiting.  Genitourinary:       Right-sided breast pain, swelling and erythema  All other systems reviewed and are negative.    Physical Exam Updated Vital Signs BP 133/77 (BP Location: Left Arm)  Pulse 117   Temp 98.2 F (36.8 C) (Oral)   Resp 18   LMP 05/25/2016   SpO2 100%   Physical Exam  Constitutional: She appears well-developed and well-nourished. No distress.  Awake, alert, nontoxic appearance  HENT:  Head: Normocephalic and atraumatic.  Mouth/Throat: Oropharynx is clear and moist. No oropharyngeal exudate.  Eyes: Conjunctivae are normal. No scleral icterus.  Neck: Normal  range of motion. Neck supple.  Cardiovascular: Regular rhythm and intact distal pulses.  Tachycardia present.   Pulses:      Radial pulses are 2+ on the right side, and 2+ on the left side.  Pulmonary/Chest: Effort normal and breath sounds normal. No respiratory distress. She has no wheezes. Right breast exhibits inverted nipple, skin change and tenderness. Left breast exhibits no tenderness. There is breast swelling.    Equal chest expansion  Abdominal: Soft. Bowel sounds are normal. She exhibits no mass. There is no tenderness. There is no rebound and no guarding.  Musculoskeletal: Normal range of motion. She exhibits no edema.  Neurological: She is alert.  Speech is clear and goal oriented Moves extremities without ataxia  Skin: Skin is warm and dry. She is not diaphoretic.  Psychiatric: She has a normal mood and affect.  Nursing note and vitals reviewed.    ED Treatments / Results  Labs (all labs ordered are listed, but only abnormal results are displayed) Labs Reviewed  CBC WITH DIFFERENTIAL/PLATELET - Abnormal; Notable for the following:       Result Value   WBC 22.0 (*)    Hemoglobin 11.4 (*)    HCT 34.5 (*)    Neutro Abs 17.7 (*)    Monocytes Absolute 1.8 (*)    All other components within normal limits  BASIC METABOLIC PANEL - Abnormal; Notable for the following:    Sodium 131 (*)    Chloride 94 (*)    Glucose, Bld 444 (*)    All other components within normal limits  URINALYSIS, ROUTINE W REFLEX MICROSCOPIC (NOT AT [facility]) - Abnormal; Notable for the following:    Specific Gravity, Urine >1.046 (*)    Glucose, UA >1000 (*)    Ketones, ur 40 (*)    All other components within normal limits  URINE MICROSCOPIC-ADD ON - Abnormal; Notable for the following:    Squamous Epithelial / LPF 6-30 (*)    Bacteria, UA FEW (*)    All other components within normal limits  CBG MONITORING, ED - Abnormal; Notable for the following:    Glucose-Capillary 417 (*)    All other  components within normal limits  CULTURE, BLOOD (ROUTINE X 2)  CULTURE, BLOOD (ROUTINE X 2)  URINE CULTURE  I-STAT CG4 LACTIC ACID, ED  I-STAT BETA HCG BLOOD, ED ([facility], [facility], [facility] ONLY)  POC URINE PREG, ED    EKG  EKG Interpretation  Date/Time:  Tuesday July 31 2016 00:46:20 EDT Ventricular Rate:  135 PR Interval:    QRS Duration: 80 QT Interval:  296 QTC Calculation: 444 R Axis:   40 Text Interpretation:  Sinus tachycardia Otherwise within normal limits No old tracing to compare Confirmed by [facility]  MD, DAVID ([id number]) on 07/31/2016 12:53:00 AM       Radiology No results found.   EMERGENCY DEPARTMENT US SOFT TISSUE INTERPRETATION "Study: Limited Ultrasound of the noted body part in comments below"  INDICATIONS: Pain and Soft tissue infection Multiple views of the body part are obtained with a multi-frequency linear probe  PERFORMED BY:  Myself  IMAGES ARCHIVED?: Yes  SIDE:Right   BODY PART:Breast  FINDINGS: Cellulitis present  LIMITATIONS:  Body Habitus and pain with procedure  INTERPRETATION:  Cellulitis present  COMMENT:  Large amounts of cellulitis with cobblestoning. It appears as if there is a deep abscess as well.    Procedures Procedures (including critical care time)  Medications Ordered in ED Medications  0.9 %  sodium chloride infusion (1,000 mLs Intravenous New Bag/Given 07/31/16 0353)  vancomycin (VANCOCIN) IVPB 1000 mg/200 mL premix (1,000 mg Intravenous New Bag/Given 07/31/16 0525)  fentaNYL (SUBLIMAZE) injection 50 mcg (50 mcg Intravenous Given 07/31/16 0519)  fentaNYL (SUBLIMAZE) injection 50 mcg (50 mcg Nasal Given 07/31/16 0345)  ondansetron (ZOFRAN-ODT) disintegrating tablet 4 mg (4 mg Oral Given 07/31/16 0108)  sodium chloride 0.9 % bolus 1,000 mL (0 mLs Intravenous Stopped 07/31/16 0501)  piperacillin-tazobactam (ZOSYN) IVPB 3.375 g (0 g Intravenous Stopped 07/31/16 0501)     Initial Impression / Assessment and Plan / ED Course  I  have reviewed the triage vital signs and the nursing notes.  Pertinent labs & imaging results that were available during my care of the patient were reviewed by me and considered in my medical decision making (see chart for details).  Clinical Course  Value Comment By Time  Glucose-Capillary: (!) 417 Elevated [employer], PA-C 10/10 [id number]  Lactic Acid, Venous: 1.28 Normal Aunna Snooks, PA-C 10/10 0346  WBC: (!) 22.0 Significantly elevated Azharia Surratt, PA-C 10/10 0346  Hemoglobin: (!) 11.4 Mild anemia Joven Mom, PA-C 10/10 0347  Pulse Rate: (!) 131 Tachycardic but afebrile. No hypotension. Dahlia Client Olivette Beckmann, PA-C 10/10 [phone]   Discussed with Dr. Gerrit Friends who will evaluate but pt will need medical admission Dierdre Forth, PA-C 10/10 [phone]  Preg Test, Ur: NEGATIVE (Reviewed) Dierdre Forth, PA-C 10/10 [phone]   Discussed  with Dr. Maryfrances Bunnell who will admit to [facility] inpatient [facility] Shanaiya Bene, PA-C 10/10 [phone]    Pt with worsening breast abscess.  Will need admission and surgery consult.    Final Clinical Impressions(s) / ED Diagnoses   Final diagnoses:  Breast abscess  Type 2 diabetes mellitus without complication, without long-term current use of insulin [facility])    New Prescriptions New Prescriptions   No medications on file     Dierdre Forth, PA-C 07/31/16 0526    Dione Booze, MD 07/31/16 [phone]

## 2016-07-31 NOTE — Care Management Note (Signed)
Case Management Note  Patient Details  Name: Elizabeth CabalKelly Krzywicki MRN: [id number] Date of Birth: 1991/04/22  Subjective/Objective: 25 y/o f admitted w/R breast abscess. From home.                   Action/Plan:d/c plan home.   Expected Discharge Date:                  Expected Discharge Plan:  Home/Self Care  In-House Referral:     Discharge planning Services  CM Consult  Post Acute Care Choice:    Choice offered to:     DME Arranged:    DME Agency:     HH Arranged:    HH Agency:     Status of Service:  In process, will continue to follow  If discussed at Long Length of Stay Meetings, dates discussed:    Additional Comments:  Lanier ClamMahabir, Jodie Cavey, RN 07/31/2016, 11:47 AM

## 2016-07-31 NOTE — Transfer of Care (Signed)
Immediate Anesthesia Transfer of Care Note  Patient: Elizabeth Blackburn  Procedure(s) Performed: Procedure(s): IRRIGATION AND DEBRIDEMENT RIGHT BREAST ABSCESS (Right)  Patient Location: PACU  Anesthesia Type:General  Level of Consciousness:  sedated, patient cooperative and responds to stimulation  Airway & Oxygen Therapy:Patient Spontanous Breathing and Patient connected to face mask oxgen  Post-op Assessment:  Report given to PACU RN and Post -op Vital signs reviewed and stable  Post vital signs:  Reviewed and stable  Last Vitals:  Vitals:   07/31/16 0500 07/31/16 0636  BP: 130/78 (!) 144/78  Pulse: 120 (!) 124  Resp: 23 (!) 21  Temp:  37.2 C    Complications: No apparent anesthesia complications

## 2016-07-31 NOTE — Anesthesia Preprocedure Evaluation (Signed)
Anesthesia Evaluation  Patient identified by MRN, date of birth, ID band Patient awake    Reviewed: Allergy & Precautions, NPO status , Patient's Chart, lab work & pertinent test results  Airway Mallampati: II  TM Distance: >3 FB Neck ROM: Full    Dental no notable dental hx.    Pulmonary neg pulmonary ROS,    Pulmonary exam normal breath sounds clear to auscultation       Cardiovascular negative cardio ROS Normal cardiovascular exam Rhythm:Regular Rate:Normal     Neuro/Psych negative neurological ROS  negative psych ROS   GI/Hepatic negative GI ROS, Neg liver ROS,   Endo/Other  diabetes, Type 2, Oral Hypoglycemic AgentsMorbid obesity  Renal/GU negative Renal ROS     Musculoskeletal negative musculoskeletal ROS (+)   Abdominal   Peds  Hematology negative hematology ROS (+)   Anesthesia Other Findings   Reproductive/Obstetrics negative OB ROS                             Anesthesia Physical Anesthesia Plan  ASA: III  Anesthesia Plan: General   Post-op Pain Management:    Induction: Intravenous  Airway Management Planned: Oral ETT  Additional Equipment:   Intra-op Plan:   Post-operative Plan: Extubation in OR  Informed Consent: I have reviewed the patients History and Physical, chart, labs and discussed the procedure including the risks, benefits and alternatives for the proposed anesthesia with the patient or authorized representative who has indicated his/her understanding and acceptance.   Dental advisory given  Plan Discussed with: CRNA  Anesthesia Plan Comments:         Anesthesia Quick Evaluation

## 2016-08-01 DIAGNOSIS — N611 Abscess of the breast and nipple: Secondary | ICD-10-CM

## 2016-08-01 DIAGNOSIS — E1065 Type 1 diabetes mellitus with hyperglycemia: Secondary | ICD-10-CM

## 2016-08-01 DIAGNOSIS — E108 Type 1 diabetes mellitus with unspecified complications: Secondary | ICD-10-CM

## 2016-08-01 LAB — GLUCOSE, CAPILLARY
GLUCOSE-CAPILLARY: 197 mg/dL — AB (ref 65–99)
GLUCOSE-CAPILLARY: 221 mg/dL — AB (ref 65–99)
GLUCOSE-CAPILLARY: 256 mg/dL — AB (ref 65–99)
GLUCOSE-CAPILLARY: 406 mg/dL — AB (ref 65–99)
Glucose-Capillary: 271 mg/dL — ABNORMAL HIGH (ref 65–99)
Glucose-Capillary: 231 mg/dL — ABNORMAL HIGH (ref 65–99)
Glucose-Capillary: 309 mg/dL — ABNORMAL HIGH (ref 65–99)

## 2016-08-01 LAB — HEMOGLOBIN A1C
Hgb A1c MFr Bld: 15 % — ABNORMAL HIGH (ref 4.8–5.6)
Mean Plasma Glucose: 384 mg/dL

## 2016-08-01 LAB — COMPREHENSIVE METABOLIC PANEL
ALBUMIN: 2.3 g/dL — AB (ref 3.5–5.0)
ALK PHOS: 76 U/L (ref 38–126)
ALT: 9 U/L — ABNORMAL LOW (ref 14–54)
ANION GAP: 6 (ref 5–15)
AST: 11 U/L — ABNORMAL LOW (ref 15–41)
BUN: 5 mg/dL — ABNORMAL LOW (ref 6–20)
CALCIUM: 8.7 mg/dL — AB (ref 8.9–10.3)
CHLORIDE: 101 mmol/L (ref 101–111)
CO2: 27 mmol/L (ref 22–32)
Creatinine, Ser: 0.44 mg/dL (ref 0.44–1.00)
GFR calc non Af Amer: >60 mL/min (ref >60)
GLUCOSE: 228 mg/dL — AB (ref 65–99)
Potassium: 3.7 mmol/L (ref 3.5–5.1)
SODIUM: 134 mmol/L — AB (ref 135–145)
Total Bilirubin: 0.4 mg/dL (ref 0.3–1.2)
Total Protein: 6.8 g/dL (ref 6.5–8.1)

## 2016-08-01 LAB — URINE CULTURE

## 2016-08-01 LAB — CBC
HCT: 31.5 % — ABNORMAL LOW (ref 36.0–46.0)
HEMOGLOBIN: 10.2 g/dL — AB (ref 12.0–15.0)
MCH: 26.4 pg (ref 26.0–34.0)
MCHC: 32.4 g/dL (ref 30.0–36.0)
MCV: 81.6 fL (ref 78.0–100.0)
PLATELETS: 357 10*3/uL (ref 150–400)
RBC: 3.86 MIL/uL — AB (ref 3.87–5.11)
RDW: 14 % (ref 11.5–15.5)
WBC: 15.3 10*3/uL — ABNORMAL HIGH (ref 4.0–10.5)

## 2016-08-01 MED ORDER — INSULIN GLARGINE 100 UNIT/ML ~~LOC~~ SOLN
10.0000 [IU] | Freq: Every day | SUBCUTANEOUS | Status: DC
  Filled 2016-08-01: qty 0.1

## 2016-08-01 MED ORDER — INSULIN ASPART 100 UNIT/ML ~~LOC~~ SOLN
0.0000 [IU] | Freq: Three times a day (TID) | SUBCUTANEOUS | Status: DC
  Administered 2016-08-01: 15 [IU] via SUBCUTANEOUS
  Administered 2016-08-01: 20 [IU] via SUBCUTANEOUS
  Administered 2016-08-02: 7 [IU] via SUBCUTANEOUS
  Administered 2016-08-02: 15 [IU] via SUBCUTANEOUS
  Administered 2016-08-02 – 2016-08-03 (×2): 4 [IU] via SUBCUTANEOUS
  Administered 2016-08-03: 7 [IU] via SUBCUTANEOUS

## 2016-08-01 MED ORDER — INSULIN ASPART 100 UNIT/ML ~~LOC~~ SOLN: 0.0000 [IU] | Freq: Every day | SUBCUTANEOUS | Status: DC

## 2016-08-01 MED ORDER — CHLORHEXIDINE GLUCONATE CLOTH 2 % EX PADS: 6.0000 | MEDICATED_PAD | Freq: Every day | CUTANEOUS | Status: DC

## 2016-08-01 MED ORDER — INSULIN ASPART 100 UNIT/ML ~~LOC~~ SOLN
8.0000 [IU] | Freq: Three times a day (TID) | SUBCUTANEOUS | Status: DC
  Administered 2016-08-02: 8 [IU] via SUBCUTANEOUS

## 2016-08-01 MED ORDER — CEFAZOLIN SODIUM-DEXTROSE 2-4 GM/100ML-% IV SOLN
2.0000 g | Freq: Once | INTRAVENOUS | Status: AC
  Administered 2016-08-01: 2 g via INTRAVENOUS
  Filled 2016-08-01: qty 100

## 2016-08-01 MED ORDER — INSULIN GLARGINE 100 UNIT/ML ~~LOC~~ SOLN
20.0000 [IU] | Freq: Every day | SUBCUTANEOUS | Status: DC
  Administered 2016-08-01: 20 [IU] via SUBCUTANEOUS
  Filled 2016-08-01: qty 0.2

## 2016-08-01 MED ORDER — CHLORHEXIDINE GLUCONATE CLOTH 2 % EX PADS
6.0000 | MEDICATED_PAD | Freq: Every day | CUTANEOUS | Status: DC
  Administered 2016-08-02 – 2016-08-03 (×2): 6 via TOPICAL

## 2016-08-01 MED ORDER — INSULIN ASPART 100 UNIT/ML ~~LOC~~ SOLN
6.0000 [IU] | Freq: Three times a day (TID) | SUBCUTANEOUS | Status: DC
  Administered 2016-08-01 (×2): 6 [IU] via SUBCUTANEOUS

## 2016-08-01 MED ORDER — MUPIROCIN 2 % EX OINT
1.0000 "application " | TOPICAL_OINTMENT | Freq: Two times a day (BID) | CUTANEOUS | Status: DC
  Administered 2016-08-01 – 2016-08-03 (×4): 1 via NASAL
  Filled 2016-08-01: qty 22

## 2016-08-01 MED ORDER — CEFAZOLIN SODIUM-DEXTROSE 2-4 GM/100ML-% IV SOLN
2.0000 g | Freq: Three times a day (TID) | INTRAVENOUS | Status: DC
  Administered 2016-08-01 – 2016-08-03 (×6): 2 g via INTRAVENOUS
  Filled 2016-08-01 (×7): qty 100

## 2016-08-01 NOTE — Progress Notes (Signed)
Pt CBG is 406. Dr. Laural BenesJohnson notified. Ordered 26 units of novolog. Orders carried out. Will continue to monitor. Educated patient on importance of not having concentrated sweets

## 2016-08-01 NOTE — Progress Notes (Signed)
PROGRESS NOTE    Elizabeth Blackburn  VQQ:[id number]  DOB: 03-Feb-1991  DOA: 07/31/2016 PCP: Elizabeth Blackburn, Elizabeth D, MD Outpatient Specialists:   Hospital course: Elizabeth CabalKelly Blackburn  is a 25 y.o. female, With history of non-insulin-dependent diabetes mellitus who came to the ED for progressive worsening of right breast pain. Patient was seen on 07/25/2016 at that time she was seen by surgery and 1.5 mL of pus was aspirated and patient was discharged on Bactrim. Initially pain began on 07/22/2016 after having bilateral nipple piercing on 07/15/2016.  Assessment & Plan:   1. Right breast abscess/sepsis- patient presented with leukocytosis, tachycardia cultures from the aspirate obtained on 07/25/2016 showed Escherichia coli, Klebsiella, MRSA. Patient started on vancomycin, cefazolin. Gen. surgery consulted by the ED physician, Dr. Gerrit Blackburn to see the patient for incision and drainage which was performed on 10/10.  Postop care per general surgery team.   2. Diabetes mellitus type 2 poorly controlled - hold oral hypoglycemic agents, add prandial coverage and increase supplemental sliding coverage  CBG (last 3)   Recent Labs  08/01/16 0023 08/01/16 0452 08/01/16 0732  GLUCAP 256* 221* 231*    DVT Prophylaxis-   Lovenox   AM Labs Ordered, also please review Full Orders  Family Communication: Admission, patients condition and plan of care including tests being ordered have been discussed with the patient and her mother at bedside who indicate understanding and agree with the plan and Code Status.  Code Status:  Full code   Consultants:  General surgery  Procedures:  I&D  Antimicrobials: Anti-infectives    Start     Dose/Rate Route Frequency Ordered Stop   08/01/16 1800  ceFAZolin (ANCEF) IVPB 2g/100 mL premix     2 g 200 mL/hr over 30 Minutes Intravenous Every 8 hours 08/01/16 1006     08/01/16 1030  ceFAZolin (ANCEF) IVPB 2g/100 mL premix     2 g 200 mL/hr over 30 Minutes Intravenous   Once 08/01/16 1006     07/31/16 1400  vancomycin (VANCOCIN) IVPB 1000 mg/200 mL premix     1,000 mg 200 mL/hr over 60 Minutes Intravenous Every 8 hours 07/31/16 0707     07/31/16 1200  piperacillin-tazobactam (ZOSYN) IVPB 3.375 g  Status:  Discontinued     3.375 g 12.5 mL/hr over 240 Minutes Intravenous Every 8 hours 07/31/16 0503 07/31/16 0524   07/31/16 0900  ceFAZolin (ANCEF) IVPB 2g/100 mL premix  Status:  Discontinued     2 g 200 mL/hr over 30 Minutes Intravenous Every 8 hours 07/31/16 0609 07/31/16 1643   07/31/16 0345  piperacillin-tazobactam (ZOSYN) IVPB 3.375 g     3.375 g 100 mL/hr over 30 Minutes Intravenous  Once 07/31/16 0336 07/31/16 0501   07/31/16 0345  vancomycin (VANCOCIN) IVPB 1000 mg/200 mL premix     1,000 mg 200 mL/hr over 60 Minutes Intravenous  Once 07/31/16 0336 07/31/16 0653       Subjective: Pt wants to advance diet this morning.   Objective: Vitals:   07/31/16 1730 07/31/16 1743 07/31/16 2149 08/01/16 0458  BP: 130/80 120/87 127/73 121/79  Pulse: (!) 107 (!) 114 86 89  Resp: 13 17 16 18   Temp: 99 F (37.2 C) 99.1 F (37.3 C) 98.4 F (36.9 C) 98.2 F (36.8 C)  TempSrc:   Oral Oral  SpO2: 100% 100% 100% 100%  Weight:      Height:        Intake/Output Summary (Last 24 hours) at 08/01/16 0830 Last data filed at  08/01/16 0700  Gross per 24 hour  Intake           2827.5 ml  Output                0 ml  Net           2827.5 ml   Filed Weights   07/31/16 0636  Weight: 105.3 kg (232 lb 2.3 oz)    Exam:  General exam: awake, alert, no distress.  Respiratory system: Clear. No increased work of breathing. Chest: wounds clean and dry.  Cardiovascular system: S1 & S2 heard, RRR. No JVD, murmurs, gallops, clicks or pedal edema. Gastrointestinal system: Abdomen is nondistended, soft and nontender. Normal bowel sounds heard. Central nervous system: Alert and oriented. No focal neurological deficits. Extremities: no CCE.  Data Reviewed: Basic  Metabolic Panel:  Recent Labs Lab 07/31/16 0313 08/01/16 0526  NA 131* 134*  K 3.9 3.7  CL 94* 101  CO2 26 27  GLUCOSE 444* 228*  BUN 9 5*  CREATININE 0.53 0.44  CALCIUM 9.0 8.7*   Liver Function Tests:  Recent Labs Lab 08/01/16 0526  AST 11*  ALT 9*  ALKPHOS 76  BILITOT 0.4  PROT 6.8  ALBUMIN 2.3*   No results for input(s): LIPASE, AMYLASE in the last 168 hours. No results for input(s): AMMONIA in the last 168 hours. CBC:  Recent Labs Lab 07/31/16 0313 08/01/16 0526  WBC 22.0* 15.3*  NEUTROABS 17.7*  --   HGB 11.4* 10.2*  HCT 34.5* 31.5*  MCV 80.2 81.6  PLT 377 357   Cardiac Enzymes: No results for input(s): CKTOTAL, CKMB, CKMBINDEX, TROPONINI in the last 168 hours. CBG (last 3)   Recent Labs  08/01/16 0023 08/01/16 0452 08/01/16 0732  GLUCAP 256* 221* 231*   Recent Results (from the past 240 hour(s))  Aerobic/Anaerobic Culture (surgical/deep wound)     Status: None   Collection Time: 07/25/16  4:42 AM  Result Value Ref Range Status   Specimen Description ABSCESS BREAST  Final   Special Requests NONE  Final   Gram Stain   Final    ABUNDANT WBC PRESENT,BOTH PMN AND MONONUCLEAR ABUNDANT GRAM POSITIVE COCCI IN PAIRS ABUNDANT GRAM VARIABLE ROD    Culture   Final    ABUNDANT METHICILLIN RESISTANT STAPHYLOCOCCUS AUREUS MODERATE ESCHERICHIA COLI FEW KLEBSIELLA PNEUMONIAE NO ANAEROBES ISOLATED Performed at [facility]    Report Status 07/30/2016 FINAL  Final   Organism ID, Bacteria METHICILLIN RESISTANT STAPHYLOCOCCUS AUREUS  Final   Organism ID, Bacteria ESCHERICHIA COLI  Final   Organism ID, Bacteria KLEBSIELLA PNEUMONIAE  Final      Susceptibility   Escherichia coli - MIC*    AMPICILLIN >=32 RESISTANT Resistant     CEFAZOLIN <=4 SENSITIVE Sensitive     CEFEPIME <=1 SENSITIVE Sensitive     CEFTAZIDIME <=1 SENSITIVE Sensitive     CEFTRIAXONE <=1 SENSITIVE Sensitive     CIPROFLOXACIN <=0.25 SENSITIVE Sensitive     GENTAMICIN <=1  SENSITIVE Sensitive     IMIPENEM <=0.25 SENSITIVE Sensitive     TRIMETH/SULFA <=20 SENSITIVE Sensitive     AMPICILLIN/SULBACTAM 4 SENSITIVE Sensitive     PIP/TAZO <=4 SENSITIVE Sensitive     Extended ESBL NEGATIVE Sensitive     * MODERATE ESCHERICHIA COLI   Klebsiella pneumoniae - MIC*    AMPICILLIN 8 RESISTANT Resistant     CEFAZOLIN <=4 SENSITIVE Sensitive     CEFEPIME <=1 SENSITIVE Sensitive     CEFTAZIDIME <=1 SENSITIVE  Sensitive     CEFTRIAXONE <=1 SENSITIVE Sensitive     CIPROFLOXACIN <=0.25 SENSITIVE Sensitive     GENTAMICIN <=1 SENSITIVE Sensitive     IMIPENEM <=0.25 SENSITIVE Sensitive     TRIMETH/SULFA <=20 SENSITIVE Sensitive     AMPICILLIN/SULBACTAM 4 SENSITIVE Sensitive     PIP/TAZO <=4 SENSITIVE Sensitive     Extended ESBL NEGATIVE Sensitive     * FEW KLEBSIELLA PNEUMONIAE   Methicillin resistant staphylococcus aureus - MIC*    CIPROFLOXACIN <=0.5 SENSITIVE Sensitive     ERYTHROMYCIN >=8 RESISTANT Resistant     GENTAMICIN <=0.5 SENSITIVE Sensitive     OXACILLIN >=4 RESISTANT Resistant     TETRACYCLINE <=1 SENSITIVE Sensitive     VANCOMYCIN 1 SENSITIVE Sensitive     TRIMETH/SULFA <=10 SENSITIVE Sensitive     CLINDAMYCIN >=8 RESISTANT Resistant     RIFAMPIN <=0.5 SENSITIVE Sensitive     Inducible Clindamycin NEGATIVE Sensitive     * ABUNDANT METHICILLIN RESISTANT STAPHYLOCOCCUS AUREUS  Blood culture (routine x 2)     Status: None (Preliminary result)   Collection Time: 07/31/16  3:19 AM  Result Value Ref Range Status   Specimen Description BLOOD LEFT HAND  Final   Special Requests BOTTLES DRAWN AEROBIC AND ANAEROBIC 5CC EACH  Final   Culture   Final    NO GROWTH < 12 HOURS Performed at [facility]    Report Status PENDING  Incomplete  Blood culture (routine x 2)     Status: None (Preliminary result)   Collection Time: 07/31/16  3:31 AM  Result Value Ref Range Status   Specimen Description BLOOD RIGHT ANTECUBITAL  Final   Special Requests BOTTLES  DRAWN AEROBIC AND ANAEROBIC 5CC EACH  Final   Culture   Final    NO GROWTH < 12 HOURS Performed at [facility]    Report Status PENDING  Incomplete  Surgical pcr screen     Status: Abnormal   Collection Time: 07/31/16  2:13 PM  Result Value Ref Range Status   MRSA, PCR NEGATIVE NEGATIVE Final   Staphylococcus aureus POSITIVE (A) NEGATIVE Final    Comment:        The Xpert SA Assay (FDA approved for NASAL specimens in patients over 59 years of age), is one component of a comprehensive surveillance program.  Test performance has been validated by [facility] for patients greater than or equal to 30 year old. It is not intended to diagnose infection nor to guide or monitor treatment.   Aerobic/Anaerobic Culture (surgical/deep wound)     Status: None (Preliminary result)   Collection Time: 07/31/16  4:22 PM  Result Value Ref Range Status   Specimen Description ABSCESS RIGHT BREAST  Final   Special Requests NONE  Final   Gram Stain   Final    MODERATE WBC PRESENT, PREDOMINANTLY PMN ABUNDANT GRAM POSITIVE COCCI IN PAIRS AND CHAINS FEW GRAM POSITIVE RODS RARE GRAM NEGATIVE RODS Performed at [facility]    Culture PENDING  Incomplete   Report Status PENDING  Incomplete     Studies: US Breast Complete Uni Right Inc Axilla  Result Date: 07/31/2016 CLINICAL DATA:  History of diabetes with recent nipple piercing, now with concern for right breast abscess. EXAM: CHEST ULTRASOUND COMPARISON:  None. FINDINGS: Provided grayscale images demonstrate an approximately 10.2 x 4.8 x 4.7 cm serpiginous complex fluid collection (though note, exact measurements are difficult secondary to the irregular shape and borders) within the inferior medial aspect of  the right breast, compatible with the patient's most painful area of concern and worrisome for a breast abscess. These findings were confirmed with real-time sonographic evaluation performed by the dictating interventional  radiologist. IMPRESSION: Concern for an at least 10.2 cm abscess within the inferior medial aspect of the right breast. Above findings were discussed with Dr. Fredricka Bonine at the time of examination completion. Electronically Signed   By: Simonne Come M.D.   On: 07/31/2016 15:04     Scheduled Meds: . enoxaparin (LOVENOX) injection  50 mg Subcutaneous Q24H  . insulin aspart  0-9 Units Subcutaneous Q4H  . morphine  15 mg Oral Q12H  . vancomycin  1,000 mg Intravenous Q8H   Continuous Infusions: . sodium chloride 1,000 mL (07/31/16 1750)    Active Problems:   Cellulitis   Breast abscess   Diabetes mellitus (HCC)  Time spent:   Standley Dakins, MD, FAAFP [facility] Pager [phone]  If 7PM-7AM, please contact night-coverage [email or web address] Password [email or web address] 08/01/2016, 8:30 AM    LOS: 1 day

## 2016-08-01 NOTE — Progress Notes (Signed)
Patient ID: Elizabeth Blackburn, female   DOB: 1991/08/01, 25 y.o.   MRN: [id number]  [facility] Progress Note  1 Day Post-Op  Subjective: Less pain than yesterday. No fevers over night.  Objective: Vital signs in last 24 hours: Temp:  [98.2 F (36.8 C)-99.4 F (37.4 C)] 98.2 F (36.8 C) (10/11 0458) Pulse Rate:  [86-121] 89 (10/11 0458) Resp:  [13-20] 18 (10/11 0458) BP: (120-147)/(73-95) 121/79 (10/11 0458) SpO2:  [99 %-100 %] 100 % (10/11 0458) Last BM Date: 07/30/16  Intake/Output from previous day: 10/10 0701 - 10/11 0700 In: 2827.5 [P.O.:240; I.V.:1987.5; IV Piggyback:600] Out: -  Intake/Output this shift: Total I/O In: 340 [P.O.:340] Out: -   PE: Gen:  Alert, NAD, pleasant Right breast: incision sites clean with pen rose drain in place, no active drainage, less tender than yesterday, less erythema than yesterday  Lab Results:   Recent Labs  07/31/16 0313 08/01/16 0526  WBC 22.0* 15.3*  HGB 11.4* 10.2*  HCT 34.5* 31.5*  PLT 377 357   BMET  Recent Labs  07/31/16 0313 08/01/16 0526  NA 131* 134*  K 3.9 3.7  CL 94* 101  CO2 26 27  GLUCOSE 444* 228*  BUN 9 5*  CREATININE 0.53 0.44  CALCIUM 9.0 8.7*   PT/INR  Recent Labs  07/31/16 1206  LABPROT 14.1  INR 1.08   CMP     Component Value Date/Time   NA 134 (L) 08/01/2016 0526   K 3.7 08/01/2016 0526   CL 101 08/01/2016 0526   CO2 27 08/01/2016 0526   GLUCOSE 228 (H) 08/01/2016 0526   BUN 5 (L) 08/01/2016 0526   CREATININE 0.44 08/01/2016 0526   CALCIUM 8.7 (L) 08/01/2016 0526   PROT 6.8 08/01/2016 0526   ALBUMIN 2.3 (L) 08/01/2016 0526   AST 11 (L) 08/01/2016 0526   ALT 9 (L) 08/01/2016 0526   ALKPHOS 76 08/01/2016 0526   BILITOT 0.4 08/01/2016 0526   GFRNONAA >60 08/01/2016 0526   GFRAA >60 08/01/2016 0526   Lipase     Component Value Date/Time   LIPASE 12 04/04/2014 2058       Studies/Results: US Breast Complete Uni Right Inc Axilla  Result Date:  07/31/2016 CLINICAL DATA:  History of diabetes with recent nipple piercing, now with concern for right breast abscess. EXAM: CHEST ULTRASOUND COMPARISON:  None. FINDINGS: Provided grayscale images demonstrate an approximately 10.2 x 4.8 x 4.7 cm serpiginous complex fluid collection (though note, exact measurements are difficult secondary to the irregular shape and borders) within the inferior medial aspect of the right breast, compatible with the patient's most painful area of concern and worrisome for a breast abscess. These findings were confirmed with real-time sonographic evaluation performed by the dictating interventional radiologist. IMPRESSION: Concern for an at least 10.2 cm abscess within the inferior medial aspect of the right breast. Above findings were discussed with Dr. Fredricka Bonine at the time of examination completion. Electronically Signed   By: Simonne Come M.D.   On: 07/31/2016 15:04    Anti-infectives: Anti-infectives    Start     Dose/Rate Route Frequency Ordered Stop   08/01/16 1800  ceFAZolin (ANCEF) IVPB 2g/100 mL premix     2 g 200 mL/hr over 30 Minutes Intravenous Every 8 hours 08/01/16 1006     08/01/16 1030  ceFAZolin (ANCEF) IVPB 2g/100 mL premix     2 g 200 mL/hr over 30 Minutes Intravenous  Once 08/01/16 1006     07/31/16 1400  vancomycin (VANCOCIN) IVPB 1000 mg/200 mL premix     1,000 mg 200 mL/hr over 60 Minutes Intravenous Every 8 hours 07/31/16 0707     07/31/16 1200  piperacillin-tazobactam (ZOSYN) IVPB 3.375 g  Status:  Discontinued     3.375 g 12.5 mL/hr over 240 Minutes Intravenous Every 8 hours 07/31/16 0503 07/31/16 0524   07/31/16 0900  ceFAZolin (ANCEF) IVPB 2g/100 mL premix  Status:  Discontinued     2 g 200 mL/hr over 30 Minutes Intravenous Every 8 hours 07/31/16 0609 07/31/16 1643   07/31/16 0345  piperacillin-tazobactam (ZOSYN) IVPB 3.375 g     3.375 g 100 mL/hr over 30 Minutes Intravenous  Once 07/31/16 0336 07/31/16 0501   07/31/16 0345  vancomycin  (VANCOCIN) IVPB 1000 mg/200 mL premix     1,000 mg 200 mL/hr over 60 Minutes Intravenous  Once 07/31/16 0336 07/31/16 [id number]       Assessment/Plan S/p Incision, drainage and debridement of right medial breast abscess 07/31/16 Dr. Fredricka Bonineonnor - POD 1 - WBC trending down 15.3 from 22.0, afebrile - packing removed and loosely repacked with saline-dampened gauze, penrose remains in place.  NIDDM   ID - vanc 10/10>>, ancef 10/10>>, zosyn 10/10>>10/10 VTE - lovenox FEN - carb modified  Plan - wound loosely packed with saline-dampened gauze, penrose remains in place, dry dressing on top. Will reassess wound tomorrow. Continue IV antibiotics. Will discuss length of stay with MD.   LOS: 1 day    [name] , [facility] 08/01/2016, 10:37 AM Pager: [phone] Consults: [phone] Mon-Fri 7:00 am-4:30 pm Sat-Sun 7:00 am-11:30 am

## 2016-08-01 NOTE — Care Management Note (Signed)
Case Management Note  Patient Details  Name: Altamese CabalKelly Mcduffey MRN: [id number] Date of Birth: 06-12-1991  Subjective/Objective: Patient has pcp,pharmacy,good support, able to afford meds.Has filled out medicaid application. Will check if [facility] community care may be a service she can use.No further CM needs.                   Action/Plan:d/c plan home.   Expected Discharge Date:                  Expected Discharge Plan:  Home/Self Care  In-House Referral:     Discharge planning Services  CM Consult  Post Acute Care Choice:    Choice offered to:     DME Arranged:    DME Agency:     HH Arranged:    HH Agency:     Status of Service:  In process, will continue to follow  If discussed at Long Length of Stay Meetings, dates discussed:    Additional Comments:  Lanier ClamMahabir, Maryclare Nydam, RN 08/01/2016, 2:24 PM

## 2016-08-01 NOTE — Care Management Note (Signed)
Case Management Note  Patient Details  Name: Elizabeth Blackburn MRN: [id number] Date of Birth: Sep 08, 1991  Subjective/Objective:   Patient is not a [facility] candidate since does not have Medicaid [employer] Access.                 Action/Plan:d/c plan home.   Expected Discharge Date:                  Expected Discharge Plan:  Home/Self Care  In-House Referral:     Discharge planning Services  CM Consult  Post Acute Care Choice:    Choice offered to:     DME Arranged:    DME Agency:     HH Arranged:    HH Agency:     Status of Service:  In process, will continue to follow  If discussed at Long Length of Stay Meetings, dates discussed:    Additional Comments:  Lanier ClamMahabir, Rosibel Giacobbe, RN 08/01/2016, 2:49 PM

## 2016-08-02 DIAGNOSIS — B372 Candidiasis of skin and nail: Secondary | ICD-10-CM

## 2016-08-02 DIAGNOSIS — E118 Type 2 diabetes mellitus with unspecified complications: Secondary | ICD-10-CM

## 2016-08-02 LAB — COMPREHENSIVE METABOLIC PANEL
ALT: 10 U/L — ABNORMAL LOW (ref 14–54)
AST: 11 U/L — ABNORMAL LOW (ref 15–41)
Albumin: 2.2 g/dL — ABNORMAL LOW (ref 3.5–5.0)
Alkaline Phosphatase: 64 U/L (ref 38–126)
Anion gap: 7 (ref 5–15)
BUN: 9 mg/dL (ref 6–20)
CO2: 28 mmol/L (ref 22–32)
Calcium: 8.8 mg/dL — ABNORMAL LOW (ref 8.9–10.3)
Chloride: 100 mmol/L — ABNORMAL LOW (ref 101–111)
Creatinine, Ser: 0.56 mg/dL (ref 0.44–1.00)
GFR calc Af Amer: >60 mL/min (ref >60)
GFR calc non Af Amer: >60 mL/min (ref >60)
Glucose, Bld: 337 mg/dL — ABNORMAL HIGH (ref 65–99)
Potassium: 4.7 mmol/L (ref 3.5–5.1)
Sodium: 135 mmol/L (ref 135–145)
Total Bilirubin: 0.2 mg/dL — ABNORMAL LOW (ref 0.3–1.2)
Total Protein: 6.6 g/dL (ref 6.5–8.1)

## 2016-08-02 LAB — CBC WITH DIFFERENTIAL/PLATELET
Basophils Absolute: 0 10*3/uL (ref 0.0–0.1)
Basophils Relative: 0 %
EOS PCT: 2 %
Eosinophils Absolute: 0.2 10*3/uL (ref 0.0–0.7)
HCT: 32.1 % — ABNORMAL LOW (ref 36.0–46.0)
Hemoglobin: 10.6 g/dL — ABNORMAL LOW (ref 12.0–15.0)
LYMPHS ABS: 3.5 10*3/uL (ref 0.7–4.0)
LYMPHS PCT: 31 %
MCH: 26 pg (ref 26.0–34.0)
MCHC: 33 g/dL (ref 30.0–36.0)
MCV: 78.9 fL (ref 78.0–100.0)
MONO ABS: 0.8 10*3/uL (ref 0.1–1.0)
MONOS PCT: 7 %
Neutro Abs: 6.9 10*3/uL (ref 1.7–7.7)
Neutrophils Relative %: 60 %
PLATELETS: 394 10*3/uL (ref 150–400)
RBC: 4.07 MIL/uL (ref 3.87–5.11)
RDW: 13.5 % (ref 11.5–15.5)
WBC: 11.4 10*3/uL — ABNORMAL HIGH (ref 4.0–10.5)

## 2016-08-02 LAB — GLUCOSE, CAPILLARY
GLUCOSE-CAPILLARY: 346 mg/dL — AB (ref 65–99)
GLUCOSE-CAPILLARY: 237 mg/dL — AB (ref 65–99)
GLUCOSE-CAPILLARY: 127 mg/dL — AB (ref 65–99)
Glucose-Capillary: 175 mg/dL — ABNORMAL HIGH (ref 65–99)

## 2016-08-02 LAB — VANCOMYCIN, TROUGH: Vancomycin Tr: 8 ug/mL — ABNORMAL LOW (ref 15–20)

## 2016-08-02 MED ORDER — SODIUM CHLORIDE 0.9 % IV SOLN
1500.0000 mg | Freq: Three times a day (TID) | INTRAVENOUS | Status: DC
  Administered 2016-08-02 – 2016-08-03 (×3): 1500 mg via INTRAVENOUS
  Filled 2016-08-02 (×5): qty 1500

## 2016-08-02 MED ORDER — CLOTRIMAZOLE 1 % VA CREA
1.0000 | TOPICAL_CREAM | Freq: Every day | VAGINAL | Status: DC
  Administered 2016-08-02 (×2): 1 via VAGINAL
  Filled 2016-08-02: qty 45

## 2016-08-02 MED ORDER — FLUCONAZOLE 100 MG PO TABS
150.0000 mg | ORAL_TABLET | Freq: Once | ORAL | Status: AC
  Administered 2016-08-02: 150 mg via ORAL
  Filled 2016-08-02: qty 2

## 2016-08-02 MED ORDER — INSULIN ASPART 100 UNIT/ML ~~LOC~~ SOLN
10.0000 [IU] | Freq: Three times a day (TID) | SUBCUTANEOUS | Status: DC
  Administered 2016-08-02 – 2016-08-03 (×4): 10 [IU] via SUBCUTANEOUS

## 2016-08-02 MED ORDER — INSULIN STARTER KIT- SYRINGES (ENGLISH)
1.0000 | Freq: Once | Status: AC
  Administered 2016-08-02: 1
  Filled 2016-08-02: qty 1

## 2016-08-02 MED ORDER — LIVING WELL WITH DIABETES BOOK
Freq: Once | Status: AC
  Administered 2016-08-02: 23:00:00
  Filled 2016-08-02: qty 1

## 2016-08-02 MED ORDER — LIVING WELL WITH DIABETES BOOK
Freq: Once | Status: DC
  Filled 2016-08-02: qty 1

## 2016-08-02 MED ORDER — METFORMIN HCL ER 500 MG PO TB24
500.0000 mg | ORAL_TABLET | Freq: Two times a day (BID) | ORAL | Status: DC
  Administered 2016-08-02 – 2016-08-03 (×2): 500 mg via ORAL
  Filled 2016-08-02 (×3): qty 1

## 2016-08-02 MED ORDER — INSULIN GLARGINE 100 UNIT/ML ~~LOC~~ SOLN
30.0000 [IU] | Freq: Every day | SUBCUTANEOUS | Status: DC
  Administered 2016-08-02: 30 [IU] via SUBCUTANEOUS
  Filled 2016-08-02 (×2): qty 0.3

## 2016-08-02 MED ORDER — INSULIN STARTER KIT- SYRINGES (ENGLISH)
1.0000 | Freq: Once | Status: DC
  Filled 2016-08-02: qty 1

## 2016-08-02 MED ORDER — FLUCONAZOLE 100 MG PO TABS
100.0000 mg | ORAL_TABLET | Freq: Every day | ORAL | Status: DC
Start: 2016-08-02 — End: 2016-08-03
  Administered 2016-08-02 – 2016-08-03 (×2): 100 mg via ORAL
  Filled 2016-08-02 (×3): qty 1

## 2016-08-02 NOTE — Progress Notes (Signed)
S: No acute events. Pain improving. Blood glucoses have still been somewhat elevated but WBC downtrending.   Vitals, labs, intake/output, and orders reviewed at this time.  Gen: A&Ox3, no distress  H&N: EOMI, atraumatic, neck supple Chest: unlabored respirations, RRR Abd: soft, nontender, nondistended Skin: Right breast with residual 10-15cm of erythema/induration and tenderness. Drainage is serous. Packing changed.  Ext: warm, no edema Neuro: grossly normal  Lines/tubes/drains: PIV, right breast penrose  A/P:  Right breast abscess POD 2 s/p I&D. Continue daily wet to dry packing changes by nursing and start patient teaching. WBC improving. Continue IV abx for now given cellulitis and hyperglycemia, which will hopefully improve as the infection subsides.    Phylliss Blakeshelsea Connor, MD [facility], [place] Pager [phone]

## 2016-08-02 NOTE — Care Management Note (Signed)
Case Management Note  Patient Details  Name: Elizabeth Blackburn MRN: [id number] Date of Birth: 10-19-1991  Subjective/Objective:  Patient may need med asst with insulin. Awaiting DM Ed cons for recc. [facility], & [facility] care [facility]. [facility]. [facility] aware.                 Action/Plan:d/c plan home.   Expected Discharge Date:                  Expected Discharge Plan:  Home/Self Care  In-House Referral:     Discharge planning Services  CM Consult  Post Acute Care Choice:    Choice offered to:     DME Arranged:    DME Agency:     HH Arranged:    HH Agency:     Status of Service:  In process, will continue to follow  If discussed at Long Length of Stay Meetings, dates discussed:    Additional Comments:  Lanier ClamMahabir, Jihan Rudy, RN 08/02/2016, 2:13 PM

## 2016-08-02 NOTE — Progress Notes (Signed)
Pharmacy Antibiotic Note  Elizabeth CabalKelly Kuna is a 25 y.o. female admitted on 07/31/2016 with cellulitis.  Pharmacy has been consulted for Ancef/Vancomycin dosing.  Plan: Ancef 2Gm IV q8h Increase vancomycin to 1500 mg IV q8h (VT=10-15)  Height: 5\' 4"  (162.6 cm) Weight: 232 lb 2.3 oz (105.3 kg) IBW/kg (Calculated) : 54.7  Temp (24hrs), Avg:98 F (36.7 C), Min:97.9 F (36.6 C), Max:98.1 F (36.7 C)   Recent Labs Lab 07/31/16 0313 07/31/16 0330 08/01/16 0526 08/02/16 0508 08/02/16 1249  WBC 22.0*  --  15.3* 11.4*  --   CREATININE 0.53  --  0.44 0.56  --   LATICACIDVEN  --  1.28  --   --   --   VANCOTROUGH  --   --   --   --  8*    Estimated Creatinine Clearance: 126 mL/min (by C-G formula based on SCr of 0.56 mg/dL).    Allergies  Allergen Reactions  . Peanut-Containing Drug Products Rash  . Tylenol [Acetaminophen] Nausea And Vomiting and Rash    Rash on chest    Antimicrobials this admission: 10/10 zosyn >> 10/10 10/10 ancef >> 10/10 vancomycin >>   Dose adjustments this admission: 10/12: 8 mcg/ml on 1 gr IV q8h, change to 1500 mg IV q8h  Microbiology results: 10/4 Breast Abscess: MRSA, E coli, Kleb pneumo - all sens to Bactrim 10/10 abscess Cx: GPCs in pairs/chains, few GPR/GNR 10/10 abscess fungus Cx: sent 10/10 BCx: ngtd 10/10 UCx: multiple species  Thank you for allowing pharmacy to be a part of this patient's care.  Adalberto ColeNikola Tonny Isensee, PharmD, BCPS Pager [phone] 08/02/2016 2:55 PM

## 2016-08-02 NOTE — Progress Notes (Signed)
Inpatient Diabetes Program Recommendations  AACE/ADA: New Consensus Statement on Inpatient Glycemic Control (2015)  Target Ranges:  Prepandial:   less than 140 mg/dL      Peak postprandial:   less than 180 mg/dL (1-2 hours)      Critically ill patients:  140 - 180 mg/dL   Lab Results  Component Value Date   GLUCAP 237 (H) 08/02/2016   HGBA1C 15.0 (H) 07/31/2016    Review of Glycemic Control  Diabetes history: DM2 Outpatient Diabetes medications: metformin 500 mg bid Current orders for Inpatient glycemic control: Lantus 30 units QHS, Novolog 10 units tidwc + 0-20 units tidwc and hs, metformin 500 mg bid  Inpatient Diabetes Program Recommendations:    For discharge - Novolin 70/30 25-30 units bid, Regular insulin - for correction.  Discussed glucose and A1C goals. Discussed importance of checking CBGs and maintaining good CBG control to prevent long-term and short-term complications. Explained how hyperglycemia leads to damage within blood vessels which lead to the common complications seen with uncontrolled diabetes. Stressed to the patient the importance of improving glycemic control to prevent further complications from uncontrolled diabetes. Discussed impact of nutrition, exercise, stress, sickness, and medications on diabetes control.Encouraged pt to check blood sugars 3-4 times/day and take logbook to PCP for any needed insulin adjustments. Pt asked good questions and seemed to understand importance of maintaining good glycemic control.  Will follow-up in am. Thank you. Ailene Ardshonda Marthann Abshier, RD, LDN, CDE Inpatient Diabetes Coordinator [phone]

## 2016-08-02 NOTE — Progress Notes (Addendum)
PROGRESS NOTE    Elizabeth Blackburn  ZOX:[id number]  DOB: 09-15-1991  DOA: 07/31/2016 PCP: Frederich Chick, MD Outpatient Specialists:   Hospital course: Elizabeth Blackburn  is a 25 y.o. female, With history of non-insulin-dependent diabetes mellitus who came to the ED for progressive worsening of right breast pain. Patient was seen on 07/25/2016 at that time she was seen by surgery and 1.5 mL of pus was aspirated and patient was discharged on Bactrim. Initially pain began on 07/22/2016 after having bilateral nipple piercing on 07/15/2016.  Assessment & Plan:   1. Right breast abscess/sepsis- patient presented with leukocytosis, tachycardia cultures from the aspirate obtained on 07/25/2016 showed Escherichia coli, Klebsiella, MRSA. Patient started on vancomycin, cefazolin. Gen. surgery consulted by the ED physician, Dr. Gerrit Friends to see the patient for incision and drainage which was performed on 10/10.  Postop care per general surgery team.   2. Diabetes mellitus type 2 poorly controlled - start glucophage XR 500 with meals to treat insulin resistance and hepatic gluconeogenesis, add prandial coverage and increase supplemental sliding coverage, increasing lantus to 30 units, novolog 10 TIDWC plus supplemental coverage, asked patient to avoid concentrated sweets, snacking between meals, etc.  Pt verbalized understanding.  Will consult diabetes coordinator for CDE education as she has decided that she would be willing to do 2 injections per day but would not be willing to do 4 injections per day.   Likely can discharge on 70/30 insulin BID with meals plus metformin.   3. Intertrigo - severe involving genitals and inner thigh - fluconazole ordered x 3 days   CBG (last 3)   Recent Labs  08/01/16 1650 08/01/16 2151 08/02/16 0747  GLUCAP 309* 197* 346*    DVT Prophylaxis-   Lovenox   AM Labs Ordered, also please review Full Orders  Family Communication: Admission, patients condition and plan of care  including tests being ordered have been discussed with the patient and her mother at bedside who indicate understanding and agree with the plan and Code Status.  Code Status:  Full code   Consultants:  General surgery  Procedures:  I&D  Antimicrobials: Anti-infectives    Start     Dose/Rate Route Frequency Ordered Stop   08/02/16 1000  fluconazole (DIFLUCAN) tablet 100 mg     100 mg Oral Daily 08/02/16 0920 08/05/16 0959   08/02/16 0030  fluconazole (DIFLUCAN) tablet 150 mg     150 mg Oral  Once 08/02/16 0024 08/02/16 0057   08/01/16 1800  ceFAZolin (ANCEF) IVPB 2g/100 mL premix     2 g 200 mL/hr over 30 Minutes Intravenous Every 8 hours 08/01/16 1006     08/01/16 1030  ceFAZolin (ANCEF) IVPB 2g/100 mL premix     2 g 200 mL/hr over 30 Minutes Intravenous  Once 08/01/16 1006 08/01/16 1151   07/31/16 1400  vancomycin (VANCOCIN) IVPB 1000 mg/200 mL premix     1,000 mg 200 mL/hr over 60 Minutes Intravenous Every 8 hours 07/31/16 0707     07/31/16 1200  piperacillin-tazobactam (ZOSYN) IVPB 3.375 g  Status:  Discontinued     3.375 g 12.5 mL/hr over 240 Minutes Intravenous Every 8 hours 07/31/16 0503 07/31/16 0524   07/31/16 0900  ceFAZolin (ANCEF) IVPB 2g/100 mL premix  Status:  Discontinued     2 g 200 mL/hr over 30 Minutes Intravenous Every 8 hours 07/31/16 0609 07/31/16 1643   07/31/16 0345  piperacillin-tazobactam (ZOSYN) IVPB 3.375 g     3.375 g 100 mL/hr over  30 Minutes Intravenous  Once 07/31/16 0336 07/31/16 0501   07/31/16 0345  vancomycin (VANCOCIN) IVPB 1000 mg/200 mL premix     1,000 mg 200 mL/hr over 60 Minutes Intravenous  Once 07/31/16 0336 07/31/16 0653      Subjective: Pt wants to advance diet this morning.   Objective: Vitals:   08/01/16 1344 08/01/16 2153 08/02/16 0506 08/02/16 0853  BP: 129/72 133/88 139/82 135/90  Pulse: (!) 113 100 96 99  Resp: 16 16 15 14   Temp: 98.3 F (36.8 C) 98.1 F (36.7 C) 97.9 F (36.6 C) 97.9 F (36.6 C)  TempSrc:  Oral Oral Oral Oral  SpO2: 96% 97% 96% 96%  Weight:      Height:        Intake/Output Summary (Last 24 hours) at 08/02/16 1122 Last data filed at 08/02/16 0450  Gross per 24 hour  Intake           2667.5 ml  Output                0 ml  Net           2667.5 ml   Filed Weights   07/31/16 0636  Weight: 105.3 kg (232 lb 2.3 oz)    Exam:  General exam: awake, alert, no distress.  Respiratory system: Clear. No increased work of breathing. Chest: wounds clean and dry.  Cardiovascular system: S1 & S2 heard, RRR. No JVD, murmurs, gallops, clicks or pedal edema. Gastrointestinal system: Abdomen is nondistended, soft and nontender. Normal bowel sounds heard. Central nervous system: Alert and oriented. No focal neurological deficits. Extremities: no CCE.  Data Reviewed: Basic Metabolic Panel:  Recent Labs Lab 07/31/16 0313 08/01/16 0526 08/02/16 0508  NA 131* 134* 135  K 3.9 3.7 4.7  CL 94* 101 100*  CO2 26 27 28   GLUCOSE 444* 228* 337*  BUN 9 5* 9  CREATININE 0.53 0.44 0.56  CALCIUM 9.0 8.7* 8.8*   Liver Function Tests:  Recent Labs Lab 08/01/16 0526 08/02/16 0508  AST 11* 11*  ALT 9* 10*  ALKPHOS 76 64  BILITOT 0.4 0.2*  PROT 6.8 6.6  ALBUMIN 2.3* 2.2*   No results for input(s): LIPASE, AMYLASE in the last 168 hours. No results for input(s): AMMONIA in the last 168 hours. CBC:  Recent Labs Lab 07/31/16 0313 08/01/16 0526 08/02/16 0508  WBC 22.0* 15.3* 11.4*  NEUTROABS 17.7*  --  6.9  HGB 11.4* 10.2* 10.6*  HCT 34.5* 31.5* 32.1*  MCV 80.2 81.6 78.9  PLT 377 357 394   Cardiac Enzymes: No results for input(s): CKTOTAL, CKMB, CKMBINDEX, TROPONINI in the last 168 hours. CBG (last 3)   Recent Labs  08/01/16 1650 08/01/16 2151 08/02/16 0747  GLUCAP 309* 197* 346*   Recent Results (from the past 240 hour(s))  Aerobic/Anaerobic Culture (surgical/deep wound)     Status: None   Collection Time: 07/25/16  4:42 AM  Result Value Ref Range Status    Specimen Description ABSCESS BREAST  Final   Special Requests NONE  Final   Gram Stain   Final    ABUNDANT WBC PRESENT,BOTH PMN AND MONONUCLEAR ABUNDANT GRAM POSITIVE COCCI IN PAIRS ABUNDANT GRAM VARIABLE ROD    Culture   Final    ABUNDANT METHICILLIN RESISTANT STAPHYLOCOCCUS AUREUS MODERATE ESCHERICHIA COLI FEW KLEBSIELLA PNEUMONIAE NO ANAEROBES ISOLATED Performed at [facility]    Report Status 07/30/2016 FINAL  Final   Organism ID, Bacteria METHICILLIN RESISTANT STAPHYLOCOCCUS AUREUS  Final  Organism ID, Bacteria ESCHERICHIA COLI  Final   Organism ID, Bacteria KLEBSIELLA PNEUMONIAE  Final      Susceptibility   Escherichia coli - MIC*    AMPICILLIN >=32 RESISTANT Resistant     CEFAZOLIN <=4 SENSITIVE Sensitive     CEFEPIME <=1 SENSITIVE Sensitive     CEFTAZIDIME <=1 SENSITIVE Sensitive     CEFTRIAXONE <=1 SENSITIVE Sensitive     CIPROFLOXACIN <=0.25 SENSITIVE Sensitive     GENTAMICIN <=1 SENSITIVE Sensitive     IMIPENEM <=0.25 SENSITIVE Sensitive     TRIMETH/SULFA <=20 SENSITIVE Sensitive     AMPICILLIN/SULBACTAM 4 SENSITIVE Sensitive     PIP/TAZO <=4 SENSITIVE Sensitive     Extended ESBL NEGATIVE Sensitive     * MODERATE ESCHERICHIA COLI   Klebsiella pneumoniae - MIC*    AMPICILLIN 8 RESISTANT Resistant     CEFAZOLIN <=4 SENSITIVE Sensitive     CEFEPIME <=1 SENSITIVE Sensitive     CEFTAZIDIME <=1 SENSITIVE Sensitive     CEFTRIAXONE <=1 SENSITIVE Sensitive     CIPROFLOXACIN <=0.25 SENSITIVE Sensitive     GENTAMICIN <=1 SENSITIVE Sensitive     IMIPENEM <=0.25 SENSITIVE Sensitive     TRIMETH/SULFA <=20 SENSITIVE Sensitive     AMPICILLIN/SULBACTAM 4 SENSITIVE Sensitive     PIP/TAZO <=4 SENSITIVE Sensitive     Extended ESBL NEGATIVE Sensitive     * FEW KLEBSIELLA PNEUMONIAE   Methicillin resistant staphylococcus aureus - MIC*    CIPROFLOXACIN <=0.5 SENSITIVE Sensitive     ERYTHROMYCIN >=8 RESISTANT Resistant     GENTAMICIN <=0.5 SENSITIVE Sensitive      OXACILLIN >=4 RESISTANT Resistant     TETRACYCLINE <=1 SENSITIVE Sensitive     VANCOMYCIN 1 SENSITIVE Sensitive     TRIMETH/SULFA <=10 SENSITIVE Sensitive     CLINDAMYCIN >=8 RESISTANT Resistant     RIFAMPIN <=0.5 SENSITIVE Sensitive     Inducible Clindamycin NEGATIVE Sensitive     * ABUNDANT METHICILLIN RESISTANT STAPHYLOCOCCUS AUREUS  Blood culture (routine x 2)     Status: None (Preliminary result)   Collection Time: 07/31/16  3:19 AM  Result Value Ref Range Status   Specimen Description BLOOD LEFT HAND  Final   Special Requests BOTTLES DRAWN AEROBIC AND ANAEROBIC 5CC EACH  Final   Culture   Final    NO GROWTH 1 DAY [facility]    Report Status PENDING  Incomplete  Blood culture (routine x 2)     Status: None (Preliminary result)   Collection Time: 07/31/16  3:31 AM  Result Value Ref Range Status   Specimen Description BLOOD RIGHT ANTECUBITAL  Final   Special Requests BOTTLES DRAWN AEROBIC AND ANAEROBIC 5CC EACH  Final   Culture   Final    NO GROWTH 1 DAY [facility]    Report Status PENDING  Incomplete  Urine culture     Status: Abnormal   Collection Time: 07/31/16  4:05 AM  Result Value Ref Range Status   Specimen Description URINE, RANDOM  Final   Special Requests NONE  Final   Culture MULTIPLE SPECIES PRESENT, SUGGEST RECOLLECTION (A)  Final   Report Status 08/01/2016 FINAL  Final  Surgical pcr screen     Status: Abnormal   Collection Time: 07/31/16  2:13 PM  Result Value Ref Range Status   MRSA, PCR NEGATIVE NEGATIVE Final   Staphylococcus aureus POSITIVE (A) NEGATIVE Final    Comment:        The Xpert SA  Assay (FDA approved for NASAL specimens in patients over 53 years of age), is one component of a comprehensive surveillance program.  Test performance has been validated by [facility] for patients greater than or equal to 68 year old. It is not intended to diagnose infection nor to guide or monitor treatment.     Aerobic/Anaerobic Culture (surgical/deep wound)     Status: None (Preliminary result)   Collection Time: 07/31/16  4:22 PM  Result Value Ref Range Status   Specimen Description ABSCESS RIGHT BREAST  Final   Special Requests NONE  Final   Gram Stain   Final    MODERATE WBC PRESENT, PREDOMINANTLY PMN ABUNDANT GRAM POSITIVE COCCI IN PAIRS AND CHAINS FEW GRAM POSITIVE RODS RARE GRAM NEGATIVE RODS Performed at [facility]    Culture   Final    CULTURE REINCUBATED FOR BETTER GROWTH NO ANAEROBES ISOLATED; CULTURE IN PROGRESS FOR 5 DAYS    Report Status PENDING  Incomplete     Studies: US Breast Complete Uni Right Inc Axilla  Result Date: 07/31/2016 CLINICAL DATA:  History of diabetes with recent nipple piercing, now with concern for right breast abscess. EXAM: CHEST ULTRASOUND COMPARISON:  None. FINDINGS: Provided grayscale images demonstrate an approximately 10.2 x 4.8 x 4.7 cm serpiginous complex fluid collection (though note, exact measurements are difficult secondary to the irregular shape and borders) within the inferior medial aspect of the right breast, compatible with the patient's most painful area of concern and worrisome for a breast abscess. These findings were confirmed with real-time sonographic evaluation performed by the dictating interventional radiologist. IMPRESSION: Concern for an at least 10.2 cm abscess within the inferior medial aspect of the right breast. Above findings were discussed with Dr. Fredricka Bonine at the time of examination completion. Electronically Signed   By: Simonne Come M.D.   On: 07/31/2016 15:04   Scheduled Meds: .  ceFAZolin (ANCEF) IV  2 g Intravenous Q8H  . Chlorhexidine Gluconate Cloth  6 each Topical Q0600  . clotrimazole  1 Applicatorful Vaginal QHS  . enoxaparin (LOVENOX) injection  50 mg Subcutaneous Q24H  . fluconazole  100 mg Oral Daily  . insulin aspart  0-20 Units Subcutaneous TID WC  . insulin aspart  0-5 Units Subcutaneous QHS  .  insulin aspart  10 Units Subcutaneous TID WC  . insulin glargine  30 Units Subcutaneous QHS  . morphine  15 mg Oral Q12H  . mupirocin ointment  1 application Nasal BID  . vancomycin  1,000 mg Intravenous Q8H   Continuous Infusions: . sodium chloride 1,000 mL (08/02/16 0247)    Active Problems:   Cellulitis   Breast abscess   Diabetes mellitus (HCC)  Time spent:   Standley Dakins, MD, FAAFP [facility] Pager [phone]  If 7PM-7AM, please contact night-coverage [email or web address] Password [email or web address] 08/02/2016, 11:22 AM    LOS: 2 days

## 2016-08-03 DIAGNOSIS — E1165 Type 2 diabetes mellitus with hyperglycemia: Secondary | ICD-10-CM

## 2016-08-03 DIAGNOSIS — L0390 Cellulitis, unspecified: Secondary | ICD-10-CM

## 2016-08-03 DIAGNOSIS — L304 Erythema intertrigo: Secondary | ICD-10-CM | POA: Diagnosis present

## 2016-08-03 DIAGNOSIS — IMO0002 Reserved for concepts with insufficient information to code with codable children: Secondary | ICD-10-CM | POA: Diagnosis present

## 2016-08-03 LAB — CBC
HEMATOCRIT: 31.3 % — AB (ref 36.0–46.0)
Hemoglobin: 10.5 g/dL — ABNORMAL LOW (ref 12.0–15.0)
MCH: 26.4 pg (ref 26.0–34.0)
MCHC: 33.5 g/dL (ref 30.0–36.0)
MCV: 78.8 fL (ref 78.0–100.0)
PLATELETS: 437 10*3/uL — AB (ref 150–400)
RBC: 3.97 MIL/uL (ref 3.87–5.11)
RDW: 13.5 % (ref 11.5–15.5)
WBC: 9.7 10*3/uL (ref 4.0–10.5)

## 2016-08-03 LAB — FUNGUS STAIN

## 2016-08-03 LAB — GLUCOSE, CAPILLARY
GLUCOSE-CAPILLARY: 249 mg/dL — AB (ref 65–99)
Glucose-Capillary: 159 mg/dL — ABNORMAL HIGH (ref 65–99)
Glucose-Capillary: 185 mg/dL — ABNORMAL HIGH (ref 65–99)

## 2016-08-03 LAB — FUNGAL STAIN REFLEX

## 2016-08-03 MED ORDER — CLOTRIMAZOLE 1 % VA CREA: 1.0000 | TOPICAL_CREAM | Freq: Every day | VAGINAL | 0 refills | Status: DC

## 2016-08-03 MED ORDER — BLOOD GLUCOSE METER KIT: PACK | 0 refills | Status: AC

## 2016-08-03 MED ORDER — SENNOSIDES-DOCUSATE SODIUM 8.6-50 MG PO TABS
2.0000 | ORAL_TABLET | Freq: Two times a day (BID) | ORAL | Status: DC
  Administered 2016-08-03: 2 via ORAL
  Filled 2016-08-03: qty 2

## 2016-08-03 MED ORDER — SULFAMETHOXAZOLE-TRIMETHOPRIM 800-160 MG PO TABS: 1.0000 | ORAL_TABLET | Freq: Two times a day (BID) | ORAL | 0 refills | Status: AC

## 2016-08-03 MED ORDER — INSULIN NPH ISOPHANE & REGULAR (70-30) 100 UNIT/ML ~~LOC~~ SUSP: 25.0000 [IU] | Freq: Two times a day (BID) | SUBCUTANEOUS | 1 refills | Status: DC

## 2016-08-03 MED ORDER — BISACODYL 10 MG RE SUPP: 10.0000 mg | Freq: Every day | RECTAL | Status: DC | PRN

## 2016-08-03 MED ORDER — "INSULIN SYRINGE 31G X 5/16"" 0.3 ML MISC": 0 refills | Status: AC

## 2016-08-03 MED ORDER — METFORMIN HCL ER 500 MG PO TB24: 500.0000 mg | ORAL_TABLET | Freq: Two times a day (BID) | ORAL | 0 refills | Status: DC

## 2016-08-03 MED ORDER — OXYCODONE HCL 5 MG PO TABS: 5.0000 mg | ORAL_TABLET | ORAL | Status: DC | PRN

## 2016-08-03 NOTE — Progress Notes (Signed)
D/C instructions reviewed w/ pt and mother. Both verbalize understanding and all questions answered. Pt d/c in stable condition in w/c to mother's car by nursing student. Pt in possession of d/c instructions, scripts, and all personal belongings as well as dressing supplies.

## 2016-08-03 NOTE — Progress Notes (Signed)
Inpatient Diabetes Program Recommendations  AACE/ADA: New Consensus Statement on Inpatient Glycemic Control (2015)  Target Ranges:  Prepandial:   less than 140 mg/dL      Peak postprandial:   less than 180 mg/dL (1-2 hours)      Critically ill patients:  140 - 180 mg/dL   Lab Results  Component Value Date   GLUCAP 185 (H) 08/03/2016   HGBA1C 15.0 (H) 07/31/2016    Review of Glycemic Control  Pt states she feels comfortable giving her insulin injections. Discussed hypoglycemia s/s and treatment. Pt's mother states she will make certain pt follows up with Dr. Shirlean Mylararol Webb in the next week. Reviewed importance of glucose monitoring   Inpatient Diabetes Program Recommendations:     ReliOn 70/30 insulin 25 units bid. Regular insulin for moderate correction scale. Will need prescription for syringes, lancets, strips.  Pt seems motivated to "get back on track" with taking care of her health and controlling her diabetes.  Thank you. Ailene Ardshonda Shanterria Franta, RD, LDN, CDE Inpatient Diabetes Coordinator [phone]

## 2016-08-03 NOTE — Progress Notes (Signed)
Patient ID: Elizabeth Blackburn, female   DOB: Aug 20, 1991, 25 y.o.   MRN: [id number]  [facility]  3 Days Post-Op  Subjective: WBC normal today and afebrile. Still very sore, but typically only using pain medication during daily dressing change.  Objective: Vital signs in last 24 hours: Temp:  [98 F (36.7 C)-98.5 F (36.9 C)] 98 F (36.7 C) (10/13 0555) Pulse Rate:  [89-95] 89 (10/13 0555) Resp:  [16-18] 18 (10/13 0555) BP: (122-129)/(61-80) 122/80 (10/13 0555) SpO2:  [97 %-99 %] 99 % (10/13 0555) Last BM Date: 07/28/16  Intake/Output from previous day: 10/12 0701 - 10/13 0700 In: 3927.5 [P.O.:240; I.V.:1887.5; IV Piggyback:1800] Out: -  Intake/Output this shift: No intake/output data recorded.  PE: Gen:  Alert, NAD, pleasant Right breast: incision sites clean with pen rose drain in place, Right breast with ~10cm of erythema/induration and tenderness. Drainage is serous. Packing changed  Lab Results:   Recent Labs  08/02/16 0508 08/03/16 0731  WBC 11.4* 9.7  HGB 10.6* 10.5*  HCT 32.1* 31.3*  PLT 394 437*   BMET  Recent Labs  08/01/16 0526 08/02/16 0508  NA 134* 135  K 3.7 4.7  CL 101 100*  CO2 27 28  GLUCOSE 228* 337*  BUN 5* 9  CREATININE 0.44 0.56  CALCIUM 8.7* 8.8*   PT/INR  Recent Labs  07/31/16 1206  LABPROT 14.1  INR 1.08   CMP     Component Value Date/Time   NA 135 08/02/2016 0508   K 4.7 08/02/2016 0508   CL 100 (L) 08/02/2016 0508   CO2 28 08/02/2016 0508   GLUCOSE 337 (H) 08/02/2016 0508   BUN 9 08/02/2016 0508   CREATININE 0.56 08/02/2016 0508   CALCIUM 8.8 (L) 08/02/2016 0508   PROT 6.6 08/02/2016 0508   ALBUMIN 2.2 (L) 08/02/2016 0508   AST 11 (L) 08/02/2016 0508   ALT 10 (L) 08/02/2016 0508   ALKPHOS 64 08/02/2016 0508   BILITOT 0.2 (L) 08/02/2016 0508   GFRNONAA >60 08/02/2016 0508   GFRAA >60 08/02/2016 0508   Lipase     Component Value Date/Time   LIPASE 12 04/04/2014 2058        Studies/Results: No results found.  Anti-infectives: Anti-infectives    Start     Dose/Rate Route Frequency Ordered Stop   08/02/16 1400  vancomycin (VANCOCIN) 1,500 mg in sodium chloride 0.9 % 500 mL IVPB     1,500 mg 250 mL/hr over 120 Minutes Intravenous Every 8 hours 08/02/16 1353     08/02/16 1000  fluconazole (DIFLUCAN) tablet 100 mg     100 mg Oral Daily 08/02/16 0920 08/05/16 0959   08/02/16 0030  fluconazole (DIFLUCAN) tablet 150 mg     150 mg Oral  Once 08/02/16 0024 08/02/16 0057   08/01/16 1800  ceFAZolin (ANCEF) IVPB 2g/100 mL premix     2 g 200 mL/hr over 30 Minutes Intravenous Every 8 hours 08/01/16 1006     08/01/16 1030  ceFAZolin (ANCEF) IVPB 2g/100 mL premix     2 g 200 mL/hr over 30 Minutes Intravenous  Once 08/01/16 1006 08/01/16 1151   07/31/16 1400  vancomycin (VANCOCIN) IVPB 1000 mg/200 mL premix  Status:  Discontinued     1,000 mg 200 mL/hr over 60 Minutes Intravenous Every 8 hours 07/31/16 0707 08/02/16 1352   07/31/16 1200  piperacillin-tazobactam (ZOSYN) IVPB 3.375 g  Status:  Discontinued     3.375 g 12.5 mL/hr over 240 Minutes Intravenous Every  8 hours 07/31/16 0503 07/31/16 0524   07/31/16 0900  ceFAZolin (ANCEF) IVPB 2g/100 mL premix  Status:  Discontinued     2 g 200 mL/hr over 30 Minutes Intravenous Every 8 hours 07/31/16 0609 07/31/16 1643   07/31/16 0345  piperacillin-tazobactam (ZOSYN) IVPB 3.375 g     3.375 g 100 mL/hr over 30 Minutes Intravenous  Once 07/31/16 0336 07/31/16 0501   07/31/16 0345  vancomycin (VANCOCIN) IVPB 1000 mg/200 mL premix     1,000 mg 200 mL/hr over 60 Minutes Intravenous  Once 07/31/16 0336 07/31/16 4098       Assessment/Plan S/p Incision, drainage and debridement of right medial breast abscess 07/31/16 Dr. Fredricka Bonine - POD 3 - WBC normalized 9.7, afebrile - blood glucose more controlled over night and this morning from previous days, ranging 127-185 - packing changed  NIDDM   ID - vanc 10/10>>,  ancef 10/10>>, zosyn 10/10>>10/10 VTE - lovenox FEN - carb modified  Plan - as long as pain can be controlled with PO medication patient is likely ready for discharge later today. Recommend at least 10 days of bactrim upon discharge. Continue daily wet to dry packing/dressing changes; patient's mother feels comfortable with wound care. Patient will follow-up with Dr. Fredricka Bonine as OP in about 10 days.   LOS: 3 days    Edson Snowball , [facility] Surgery 08/03/2016, 9:05 AM Pager: [phone] Consults: [phone] Mon-Fri 7:00 am-4:30 pm Sat-Sun 7:00 am-11:30 am

## 2016-08-03 NOTE — Care Management Note (Signed)
Case Management Note  Patient Details  Name: Elizabeth Blackburn MRN: [id number] Date of Birth: 1991/09/05  Subjective/Objective: Patient approved for charity by [facility]-rep aware of HHRN-DM education,insulin instruction-await HHRN,[id number] order.Will need script for insulin,lancets,strips.MATCH Program approved-patient informed of policy-1 time use/within 12 month calendar year/$3 copay(can afford);select pharmacies/use within 7 days from d/c.                    Action/Plan:d/c plan home w/HHC.   Expected Discharge Date:                  Expected Discharge Plan:  Home w Home Health Services  In-House Referral:     Discharge planning Services  [facility]  Post Acute Care Choice:  Durable Medical Equipment (glucometer) Choice offered to:  Patient  DME Arranged:    DME Agency:     HH Arranged:  RN HH Agency:  [facility]  Status of Service:  Completed, signed off  If discussed at Long Length of Stay Meetings, dates discussed:    Additional Comments:  Lanier ClamMahabir, Madalynn Pickelsimer, RN 08/03/2016, 12:21 PM

## 2016-08-03 NOTE — Discharge Instructions (Addendum)
Please check blood sugars 4 times per day.  Please call your primary care physician for any blood sugars less than 100.  If you have any blood sugars less than 100 please reduce your insulin dose by 5 units and notify your primary care physician.   Bring your blood sugar numbers with you to your follow up appointment with your primary care physician. Return if symptoms recur, worsen or new problems develop.     Blood Glucose Monitoring, Adult Monitoring your blood glucose (also know as blood sugar) helps you to manage your diabetes. It also helps you and your health care provider monitor your diabetes and determine how well your treatment plan is working. WHY SHOULD YOU MONITOR YOUR BLOOD GLUCOSE?  It can help you understand how food, exercise, and medicine affect your blood glucose.  It allows you to know what your blood glucose is at any given moment. You can quickly tell if you are having low blood glucose (hypoglycemia) or high blood glucose (hyperglycemia).  It can help you and your health care provider know how to adjust your medicines.  It can help you understand how to manage an illness or adjust medicine for exercise. WHEN SHOULD YOU TEST? Your health care provider will help you decide how often you should check your blood glucose. This may depend on the type of diabetes you have, your diabetes control, or the types of medicines you are taking. Be sure to write down all of your blood glucose readings so that this information can be reviewed with your health care provider. See below for examples of testing times that your health care provider may suggest. Type 1 Diabetes  Test at least 2 times per day if your diabetes is well controlled, if you are using an insulin pump, or if you perform multiple daily injections.  If your diabetes is not well controlled or if you are sick, you may need to test more often.  It is a good idea to also test:  Before every insulin  injection.  Before and after exercise.  Between meals and 2 hours after a meal.  Occasionally between 2:00 a.m. and 3:00 a.m. Type 2 Diabetes  If you are taking insulin, test at least 2 times per day. However, it is best to test before every insulin injection.  If you take medicines by mouth (orally), test 2 times a day.  If you are on a controlled diet, test once a day.  If your diabetes is not well controlled or if you are sick, you may need to monitor more often. HOW TO MONITOR YOUR BLOOD GLUCOSE Supplies Needed  Blood glucose meter.  Test strips for your meter. Each meter has its own strips. You must use the strips that go with your own meter.  A pricking needle (lancet).  A device that holds the lancet (lancing device).  A journal or log book to write down your results. Procedure  Wash your hands with soap and water. Alcohol is not preferred.  Prick the side of your finger (not the tip) with the lancet.  Gently milk the finger until a small drop of blood appears.  Follow the instructions that come with your meter for inserting the test strip, applying blood to the strip, and using your blood glucose meter. Other Areas to Get Blood for Testing Some meters allow you to use other areas of your body (other than your finger) to test your blood. These areas are called alternative sites. The most common alternative  sites are:  The forearm.  The thigh.  The back area of the lower leg.  The palm of the hand. The blood flow in these areas is slower. Therefore, the blood glucose values you get may be delayed, and the numbers are different from what you would get from your fingers. Do not use alternative sites if you think you are having hypoglycemia. Your reading will not be accurate. Always use a finger if you are having hypoglycemia. Also, if you cannot feel your lows (hypoglycemia unawareness), always use your fingers for your blood glucose checks. ADDITIONAL TIPS FOR  GLUCOSE MONITORING  Do not reuse lancets.  Always carry your supplies with you.  All blood glucose meters have a 24-hour "hotline" number to call if you have questions or need help.  Adjust (calibrate) your blood glucose meter with a control solution after finishing a few boxes of strips. BLOOD GLUCOSE RECORD KEEPING It is a good idea to keep a daily record or log of your blood glucose readings. Most glucose meters, if not all, keep your glucose records stored in the meter. Some meters come with the ability to download your records to your home computer. Keeping a record of your blood glucose readings is especially helpful if you are wanting to look for patterns. Make notes to go along with the blood glucose readings because you might forget what happened at that exact time. Keeping good records helps you and your health care provider to work together to achieve good diabetes management.    This information is not intended to replace advice given to you by your health care provider. Make sure you discuss any questions you have with your health care provider.   Document Released: 10/11/2003 Document Revised: 10/29/2014 Document Reviewed: 03/02/2013 Elsevier Interactive Patient Education 2016 [employer].  How to Avoid Diabetes Problems You can do a lot to prevent or slow down diabetes problems. Following your diabetes plan and taking care of yourself can reduce your risk of serious or life-threatening complications. Below, you will find certain things you can do to prevent diabetes problems. MANAGE YOUR DIABETES Follow your health care provider's, nurse educator's, and dietitian's instructions for managing your diabetes. They will teach you the basics of diabetes care. They can help answer questions you may have. Learn about diabetes and make healthy choices regarding eating and physical activity. Monitor your blood glucose level regularly. Your health care provider will help you decide how  often to check your blood glucose level depending on your treatment goals and how well you are meeting them.  DO NOT USE NICOTINE Nicotine and diabetes are a dangerous combination. Nicotine raises your risk for diabetes problems. If you quit using nicotine, you will lower your risk for heart attack, stroke, nerve disease, and kidney disease. Your cholesterol and your blood pressure levels may improve. Your blood circulation will also improve. Do not use any tobacco products, including cigarettes, chewing tobacco, or electronic cigarettes. If you need help quitting, ask your health care provider. KEEP YOUR BLOOD PRESSURE UNDER CONTROL Your health care provider will determine your individualized target blood pressure based on your age, your medicines, how long you have had diabetes, and any other medical conditions you have. Blood pressure consists of two numbers. Generally, the goal is to keep your top number (systolic pressure) at or below 130, and your bottom number (diastolic pressure) at or below 80. Your health care provider may recommend a lower target blood pressure reading, if appropriate. Meal planning, medicines, and  exercise can help you reach your target blood pressure. Make sure your health care provider checks your blood pressure at every visit. KEEP YOUR CHOLESTEROL UNDER CONTROL Normal cholesterol levels will help prevent heart disease and stroke. These are the biggest health problems for people with diabetes. Keeping cholesterol levels under control can also help with blood flow. Have your cholesterol level checked at least once a year. Your health care provider may prescribe a medicine known as a statin. Statins lower your cholesterol. If you are not taking a statin, ask your health care provider if you should be. Meal planning, exercise, and medicines can help you reach your cholesterol targets.  SCHEDULE AND KEEP YOUR ANNUAL PHYSICAL EXAMS AND EYE EXAMS Your health care provider will tell  you how often he or she wants to see you depending on your plan of treatment. It is important that you keep these appointments so that possible problems can be identified early and complications can be avoided or treated.  Every visit with your health care provider should include your weight, blood pressure, and an evaluation of your blood glucose control.  Your hemoglobin A1c should be checked:  At least twice a year if you are at your goal.  Every 3 months if there are changes in treatment.  If you are not meeting your goals.  Your blood lipids should be checked yearly. You should also be checked yearly to see if you have protein in your urine (microalbumin).  Schedule a dilated eye exam within 5 years of your diagnosis if you have type 1 diabetes, and then yearly. Schedule a dilated eye exam at diagnosis if you have type 2 diabetes, and then yearly. All exams thereafter can be extended to every 2 to 3 years if one or more exams have been normal. KEEP YOUR VACCINES CURRENT It is recommended that you receive a flu (influenza) vaccine every year. It is also recommended that you receive a pneumonia (pneumococcal) vaccine. If you are 25 years of age or older and have never received a pneumonia vaccine, this vaccine may be given as a series of two separate shots. Ask your health care provider which additional vaccines may be recommended. TAKE CARE OF YOUR FEET  Diabetes may cause you to have a poor blood supply (circulation) to your legs and feet. Because of this, the skin may be thinner, break easier, and heal more slowly. You also may have nerve damage in your legs and feet, causing decreased feeling. You may not notice minor injuries to your feet that could lead to serious problems or infections. Taking care of your feet is very important. Visual foot exams are performed at every routine medical visit. The exams check for cuts, injuries, or other problems with the feet. A comprehensive foot exam  should be done yearly. This includes visual inspection as well as assessing foot pulses and testing for loss of sensation. You should also do the following:  Inspect your feet daily for cuts, calluses, blisters, ingrown toenails, and signs of infection, such as redness, swelling, or pus.  Wash and dry your feet thoroughly, especially between the toes.  Avoid soaking your feet regularly in hot water baths.  Moisturize dry skin with lotion, avoiding areas between your toes.  Cut toenails straight across and file the edges.  Avoid shoes that do not fit well or have areas that irritate your skin.  Avoid going barefooted or wearing only socks. Your feet need protection. TAKE CARE OF YOUR TEETH People with poorly  controlled diabetes are more likely to have gum (periodontal) disease. These infections make diabetes harder to control. Periodontal diseases, if left untreated, can lead to tooth loss. Brush your teeth twice a day, floss, and see your dentist for checkups and cleaning every 6 months, or 2 times a year. ASK YOUR HEALTH CARE PROVIDER ABOUT TAKING ASPIRIN Taking aspirin daily is recommended to help prevent cardiovascular disease in people with and without diabetes. Ask your health care provider if this would benefit you and what dose he or she would recommend. DRINK RESPONSIBLY Moderate amounts of alcohol (less than 1 drink per day for adult women and less than 2 drinks per day for adult men) have a minimal effect on blood glucose if ingested with food. It is important to eat food with alcohol to avoid hypoglycemia. People should avoid alcohol if they have a history of alcohol abuse or dependence, if they are pregnant, and if they have liver disease, pancreatitis, advanced neuropathy, or severe hypertriglyceridemia. LESSEN STRESS Living with diabetes can be stressful. When you are under stress, your blood glucose may be affected in two ways:  Stress hormones may cause your blood glucose to  rise.  You may be distracted from taking good care of yourself. It is a good idea to be aware of your stress level and make changes that are necessary to help you better manage challenging situations. Support groups, planned relaxation, a hobby you enjoy, meditation, healthy relationships, and exercise all work to lower your stress level. If your efforts do not seem to be helping, get help from your health care provider or a trained mental health professional.   This information is not intended to replace advice given to you by your health care provider. Make sure you discuss any questions you have with your health care provider.   Document Released: 06/26/2011 Document Revised: 10/29/2014 Document Reviewed: 12/02/2013 Elsevier Interactive Patient Education 2016 [employer] Dressing Change A dressing is a material placed over wounds. It keeps the wound clean, dry, and protected from further injury. This provides an environment that favors wound healing.  BEFORE YOU BEGIN  Get your supplies together. Things you may need include:  Saline solution.  Flexible gauze dressing.  Medicated cream.  Tape.  Gloves.  Abdominal dressing pads.  Gauze squares.  Plastic bags.  Take pain medicine 30 minutes before the dressing change if you need it.  Take a shower before you do the first dressing change of the day. Use plastic wrap or a plastic bag to prevent the dressing from getting wet. REMOVING YOUR OLD DRESSING   Wash your hands with soap and water. Dry your hands with a clean towel.  Put on your gloves.  Remove any tape.  Carefully remove the old dressing. If the dressing sticks, you may dampen it with warm water to loosen it, or follow your caregiver's specific directions.  Remove any gauze or packing tape that is in your wound.  Take off your gloves.  Put the gloves, tape, gauze, or any packing tape into a plastic bag. CHANGING YOUR DRESSING  Open the supplies.  Take  the cap off the saline solution.  Open the gauze package so that the gauze remains on the inside of the package.  Put on your gloves.  Clean your wound as told by your caregiver.  If you have been told to keep your wound dry, follow those instructions.  Your caregiver may tell you to do one or more of the following:  Pick  up the gauze. Pour the saline solution over the gauze. Squeeze out the extra saline solution.  Put medicated cream or other medicine on your wound if you have been told to do so.  Put the solution soaked gauze only in your wound, not on the skin around it.  Pack your wound loosely or as told by your caregiver.  Put dry gauze on your wound.  Put abdominal dressing pads over the dry gauze if your wet gauze soaks through.  Tape the abdominal dressing pads in place so they will not fall off. Do not wrap the tape completely around the affected part (arm, leg, abdomen).  Wrap the dressing pads with a flexible gauze dressing to secure it in place.  Take off your gloves. Put them in the plastic bag with the old dressing. Tie the bag shut and throw it away.  Keep the dressing clean and dry until your next dressing change.  Wash your hands. SEEK MEDICAL CARE IF:  Your skin around the wound looks red.  Your wound feels more tender or sore.  You see pus in the wound.  Your wound smells bad.  You have a fever.  Your skin around the wound has a rash that itches and burns.  You see black or yellow skin in your wound that was not there before.  You feel nauseous, throw up, and feel very tired.   This information is not intended to replace advice given to you by your health care provider. Make sure you discuss any questions you have with your health care provider.   Document Released: 11/15/2004 Document Revised: 12/31/2011 Document Reviewed: 08/20/2011 Elsevier Interactive Patient Education 2016 Else[employer] Inc[employer]   Hypoglycemia Low blood sugar (hypoglycemia)  means that the level of sugar in your blood is lower than it should be. Signs of low blood sugar include:  Getting sweaty.  Feeling hungry.  Feeling dizzy or weak.  Feeling sleepier than normal.  Feeling nervous.  Headaches.  Having a fast heartbeat. Low blood sugar can happen fast and can be an emergency. Your doctor can do tests to check your blood sugar level. You can have low blood sugar and not have diabetes. HOME CARE  Check your blood sugar as told by your doctor. If it is less than 70 mg/dl or as told by your doctor, take 1 of the following:  3 to 4 glucose tablets.   cup clear juice.   cup soda pop, not diet.  1 cup milk.  5 to 6 hard candies.  Recheck blood sugar after 15 minutes. Repeat until it is at the right level.  Eat a snack if it is more than 1 hour until the next meal.  Only take medicine as told by your doctor.  Do not skip meals. Eat on time.  Do not drink alcohol except with meals.  Check your blood glucose before driving.  Check your blood glucose before and after exercise.  Always carry treatment with you, such as glucose pills.  Always wear a medical alert bracelet if you have diabetes. GET HELP RIGHT AWAY IF:   Your blood glucose goes below 70 mg/dl or as told by your doctor, and you:  Are confused.  Are not able to swallow.  Pass out (faint).  You cannot treat yourself. You may need someone to help you.  You have low blood sugar problems often.  You have problems from your medicines.  You are not feeling better after 3 to 4 days.  You  have vision changes. MAKE SURE YOU:   Understand these instructions.  Will watch this condition.  Will get help right away if you are not doing well or get worse.   This information is not intended to replace advice given to you by your health care provider. Make sure you discuss any questions you have with your health care provider.   Document Released: 01/02/2010 Document Revised:  10/29/2014 Document Reviewed: 06/14/2015 Elsevier Interactive Patient Education 2016 [employer].   How and Where to Give Subcutaneous Insulin Injections, Adult People with type 1 diabetes must take insulin since their bodies do not make it. People with type 2 diabetes may require insulin. There are many different types of insulin as well as other injectable diabetes medicines that are meant to be injected into the fat layer under your skin. The type of insulin or injectable diabetes medicine you take may determine how many injections you give yourself and when to take the injections.  CHOOSING A SITE FOR INJECTION Insulin absorption varies from site to site. As with any injectable medication it is best for the insulin to be injected within the same body region. However, do not inject the insulin in the same spot each time. Rotating the spots you give your injections will prevent inflammation or tissue breakdown. There are four main regions that can be used for injections. The regions include the:  Abdomen (preferred region, especially for non-insulin injectable diabetes medicine).  Front and upper outer sides of thighs.  Back of upper arm.  Buttocks. USING A SYRINGE AND VIAL Drawing up insulin: single insulin dose 1. Wash your hands with soap and water. 2. Gently roll the insulin bottle (vial) between your hands to mix it. Do not shake the vial. 3. Clean the top rubber part of the vial with an alcohol wipe. Be sure that the plastic pop-top has been removed on newer vials. 4. Remove the plastic cover from the needle on the syringe. Do not let the needle touch anything. 5. Pull the plunger back to draw air into the syringe. The air should be the same amount as the insulin dose. 6. Push the needle through the rubber on the top of the vial. Do not turn the vial over. 7. Push the plunger in all the way to put the air into the vial. 8. Leave the needle in the vial and turn the vial and syringe  upside down. 9. Pull down slowly on the plunger, drawing the amount of insulin you need into the syringe. 10. Look for air bubbles in the syringe. You may need to push the plunger up and down 2 to 3 times to slowly get rid of any air bubbles in the syringe. 11. Pull back the plunger to get your correct dose. 12. Remove the needle from the vial. 13. Use an alcohol wipe to clean the area of the body to be injected. 14. Pinch up 1 inch of skin and hold it. 15. Put the needle straight into the skin (90-degree angle). Put the needle in as far as it will go (to the hub). The needle may need to be injected at a 45-degree angle in small adults with little fat. 16. When the needle is in, you can let go of your skin. 17. Push the plunger down all the way to inject the insulin. 18. Pull the needle straight out of the skin. 19. Press the alcohol wipe over the spot where you gave your injection. Keep it there for a few  seconds. Do not rub the area. 20. Do not put the plastic cover back on the needle. Drawing up insulin: mixing 2 insulins 1. Wash your hands with soap and water. 2. Gently roll the vial of "cloudy" insulin between your hands or rotate the vial from top to bottom to mix. 3. Clean the top of both vials with an alcohol wipe. Be sure that the plastic pop-top lid has been removed on newer vials. 4. Pull air into the syringe to equal the dose of "cloudy" insulin. 5. Stick the needle into the "cloudy" insulin vial and inject the air. Be sure to keep the vial upright. 6. Remove the needle from the "cloudy" insulin vial. 7. Pull air into the syringe to equal the dose of "clear" insulin. 8. Stick the needle into the "clear" insulin vial and inject the air. 9. Leave the needle in the "clear" insulin vial and turn the vial upside down. 10. Pull down on the plunger and slowly draw into the syringe the number of units of "clear" insulin desired. 11. Look for air bubbles in the syringe. You may need to  push the plunger up and down 2 to 3 times to slowly get rid of any air bubbles in the syringe. 12. Remove the needle from the "clear" insulin vial. 13. Stick the needle into the "cloudy" insulin vial. Do not inject any of the "clear" insulin into the "cloudy" vial. 14. Turn the "cloudy" vial upside down and pull the plunger down to the number of units that equals the total number of units of "clear" and "cloudy" insulins. 15. Remove the needle from the "cloudy" insulin vial. 16. Use an alcohol wipe to clean the area of the body to be injected. 17. Put the needle straight into the skin (90-degree angle). Put the needle in as far as it will go (to the hub). The needle may need to be injected at a 45-degree angle in small adults with little fat. 18. When the needle is in, you can let go of your skin. 19. Push the plunger down all the way to inject the insulin. 20. Pull the needle straight out of the skin. 21. Press the alcohol wipe over the spot where you gave your injection. Keep it there for a few seconds. Do not rub the area. 22. Do not put the plastic cover back on the needle. USING INSULIN PENS 1. Wash your hands with soap and water. 2. If you are using the "cloudy" insulin, roll the pen between your palms several times or rotate the pen top to bottom several times. 3. Remove the insulin pen cap. 4. Clean the rubber stopper of the cartridge with an alcohol wipe. 5. Remove the protective paper tab from the disposable needle. 6. Screw the needle onto the pen. 7. Remove the outer plastic needle cover. 8. Remove the inner plastic needle cover. 9. Prime the insulin pen by turning the button (dial) to 2 units. Hold the pen with the needle pointing up, and push the dial on the opposite end until a drop of insulin appears at the needle tip. If no insulin appears, repeat this step. 10. Dial the number of units of insulin you will inject. 11. Use an alcohol wipe to clean the area of the body to be  injected. 12. Pinch up 1 inch of skin and hold it. 13. Put the needle straight into the skin (90-degree angle). 14. Push the dial down to push the insulin into the fat tissue. 15. Count to  10 slowly. Then, remove the needle from the fat tissue. 16. Carefully replace the larger outer plastic needle cover over the needle and unscrew the capped needle. THROWING AWAY SUPPLIES  Discard used needles in a puncture proof sharps disposal container. Follow disposal regulations for the area where you live.  Vials and empty disposable pens may be thrown away in the regular trash.   This information is not intended to replace advice given to you by your health care provider. Make sure you discuss any questions you have with your health care provider.   Document Released: 12/29/2003 Document Revised: 10/29/2014 Document Reviewed: 03/17/2013 Elsevier Interactive Patient Education 2016 [employer]  Insulin Treatment for Diabetes Diabetes is a disease that does not go away (chronic). It occurs when the body does not properly use the sugar (glucose) that is released from food after it is digested. Glucose levels are controlled by a hormone called insulin, which is made by your pancreas. Depending on the type of diabetes you have, either of the following will apply:   The pancreas does not make any insulin (type 1 diabetes).  The pancreas makes too little insulin, and the body cannot respond normally to the insulin that is made (type 2 diabetes). Without insulin, death can occur. However, with the addition of insulin, blood sugar monitoring, and treatment, someone with diabetes can live a full and productive life. This document will discuss the role of insulin in your treatment and provide information about its use.  HOW IS INSULIN GIVEN? Insulin is a medicine that can only be given by injection. Taking it by mouth makes it inactive because of the acid in your stomach. Insulin is injected under the skin by  a syringe and needle, an insulin pen, a pump, or a jet injector. Your dose will be determined by your health care provider based on your individual needs. You will also be given guidance on which method of giving insulin is right for you. Remember that if you give insulin with a needle and syringe, you must do so using only a special insulin syringe made for this purpose. WHERE ON THE BODY SHOULD INSULIN BE INJECTED? Insulin is injected into the fatty layer of tissue just under your skin. Good places to inject insulin include the upper arm, the front and outer area of the thigh, the hips, and the abdomen. Giving your insulin in the abdomen is preferred because this provides the most rapid and consistent absorption. Avoid the area 2 inches (5 cm) around the navel and avoid injecting into areas on your body with scar tissue. In addition, it is important to rotate your injection sites with every shot to prevent irritation and improve absorption. WHAT ARE THE DIFFERENT TYPES OF INSULIN?  If you have type 1 diabetes, you must take insulin to stay alive. Your body does not produce it. If you have type 2 diabetes, you might require insulin in addition to, or instead of, other medicines. In either case, proper use of insulin is critical to control your diabetes.  There are a number of different types of insulin. Usually, you will give yourself injections, though others can be trained to give them to you. Some people have an insulin pump that delivers insulin continuously through a tube (cannula) that is placed under the skin. Using insulin requires that you check your blood sugar several times a day. The exact number of times and time of day to check will vary depending on your type of diabetes, your type  of insulin, and treatment goals. Your health care provider will direct you.  Generally, different insulins have different properties. The following is a general guide. Specifics will vary by product, and new  products are introduced periodically.   Rapid-acting insulin starts working quickly (in as little as 5 minutes) and wears off in 4 to 6 hours (sometimes longer). This type of insulin works well when taken just before a meal to bring your blood sugar quickly back to normal.   Short-acting insulin starts working in about 30 minutes and can last 6 to 10 hours. This type of insulin should be taken about 30 minutes before you start eating a meal.  Intermediate-acting insulin starts working in 1-2 hours and wears off after about 10 to 18 hours. This insulin will lower your blood sugar for a longer period of time, but it will not be as effective in lowering your blood sugar right after a meal.   Long-acting insulin mimics the small amount of insulin that your pancreas usually produces throughout the day. You need to have some insulin present at all times. It is crucial to the metabolism of brain cells and other cells. Long-acting insulin is meant to be used either once or twice a day. It is usually used in combination with other types of insulin, or in combination with other diabetes medicines.  Discuss the type of insulin you are taking with your health care provider or pharmacist. You will then be aware of when the insulin can be expected to peak and when it will wear off. This is important to know so you can plan for meal times and periods of exercise.  Your health care provider will usually have a strategy in mind when treating you with insulin. This will vary with your type of diabetes, your diabetes treatment goals, and your health history. It is important that you understand this strategy so you can be an active partner in treating your diabetes. Here are some terms you might hear:   Basal insulin. This refers to the small amount of insulin that needs to be present in your blood at all times. Sometimes oral medicines will be enough. For other people, and especially for people with type 1 diabetes,  insulin is needed. Usually, intermediate-acting or long-acting insulin is used once or twice a day to accomplish this.   Prandial (meal-related) insulin. Your blood sugar will rise rapidly after a meal. Rapid-acting or short-acting insulin can be used right before the meal to bring your blood sugar back to normal quickly. You might be instructed to adjust the amount of insulin depending on how much carbohydrate (starch) is in your meal.   Corrective insulin. You might be instructed to check your blood sugar at certain times of the day. You then might use a small amount of rapid-acting or short-acting insulin to bring the blood sugar down to normal if it is elevated.   Tight control (also called intensive therapy). Tight control means keeping your blood sugar as close to your target as possible and keeping it from going too high after meals. People with tight control of their diabetes are shown to have fewer long-term problems from their diabetes.   Glycohemoglobin (also called glyco, glycosylated hemoglobin, hemoglobin A1c, or A1c) level. This measures how well your blood sugar has been controlled during the past 1 to 3 months. It helps your health care provider see how effective your treatment is and decide if any changes are needed. Your health care provider will  discuss your target glycohemoglobin level with you.  Insulin treatment requires your careful attention. While you are being treated with insulin, you should check your blood glucose at least two times each day. Treatment plans will be different for different people. Some people do well with a simple program. Others require more complicated programs, with multiple insulin injections daily. You will work with your health care provider to develop the best program for you. Regardless of your insulin treatment plan, you must also do your best on weight control, diet and food choices, exercise, blood pressure control, cholesterol control, and  stress levels.  WHAT ARE THE SIDE EFFECTS OF INSULIN? Although insulin treatment is important, it does have some side effects, such as:   Insulin can cause your blood sugar to go too low (hypoglycemia).   Weight gain can occur.   Improper injection technique can cause hypoglycemia, blood sugar to go too high (hyperglycemia), skin injury or irritation, or other problems. You must learn to inject insulin properly.   This information is not intended to replace advice given to you by your health care provider. Make sure you discuss any questions you have with your health care provider.   Document Released: 01/04/2009 Document Revised: 10/29/2014 Document Reviewed: 03/22/2013 [employer] Interactive Patient Education 2016 [employer]   Abscess An abscess (boil or furuncle) is an infected area on or under the skin. This area is filled with yellowish-white fluid (pus) and other material (debris). HOME CARE   Only take medicines as told by your doctor.  If you were given antibiotic medicine, take it as directed. Finish the medicine even if you start to feel better.  If gauze is used, follow your doctor's directions for changing the gauze.  To avoid spreading the infection:  Keep your abscess covered with a bandage.  Wash your hands well.  Do not share personal care items, towels, or whirlpools with others.  Avoid skin contact with others.  Keep your skin and clothes clean around the abscess.  Keep all doctor visits as told. GET HELP RIGHT AWAY IF:   You have more pain, puffiness (swelling), or redness in the wound site.  You have more fluid or blood coming from the wound site.  You have muscle aches, chills, or you feel sick.  You have a fever. MAKE SURE YOU:   Understand these instructions.  Will watch your condition.  Will get help right away if you are not doing well or get worse.   This information is not intended to replace advice given to you by your health  care provider. Make sure you discuss any questions you have with your health care provider.   Document Released: 03/26/2008 Document Revised: 04/08/2012 Document Reviewed: 12/22/2011 [employer] Interactive Patient Education [employer]

## 2016-08-03 NOTE — Discharge Summary (Signed)
Physician Discharge Summary  Elizabeth Blackburn ZOX:[id number] DOB: Feb 25, 1991 DOA: 07/31/2016  PCP: Frederich Chick, MD  Admit date: 07/31/2016 Discharge date: 08/03/2016  Admitted From: Home Disposition:  Home  Recommendations for Outpatient Follow-up:  1. Follow up with PCP in 1 weeks 2. Please follow up with surgery as scheduled 3. Please adjust diabetes medications as needed to improve glycemic control   Discharge Condition: Stable  CODE STATUS: Full  Diet recommendation: Carb Modified   Brief/Interim Summary: Elizabeth Blackburn a 25 y.o.female,With history of non-insulin-dependent diabetes mellitus who came to the ED for progressive worsening of right breast pain. Patient was seen on 07/25/2016 at that time she was seen by surgery and 1.5 mL of pus was aspirated and patient was discharged on Bactrim. Initially pain began on 07/22/2016 after having bilateral nipple piercing on 07/15/2016.  Assessment & Plan:   1. Right breast abscess/sepsis- patient presented with leukocytosis, tachycardia cultures from the aspirate obtained on 07/25/2016 showed Escherichia coli, Klebsiella, MRSA. Patient started on vancomycin, cefazolin.Gen. surgery consulted by the ED physician, Dr. Gerrit Friends to see the patient for incision and drainage which was performed on 10/10.  Postop care per general surgery team.  Pt improved after procedure and worked to control blood sugars with insulin.  WBC trending down.  Wound healing better.  Surgery said Ok to discharge home, mother will help with home wound care.  Pt to be discharged on 10 days of Bactrim DS.  Pt will be sent home on insulin.     2. Diabetes mellitus type 2 poorly controlled - started glucophage XR 500 with meals to treat insulin resistance and hepatic gluconeogenesis.  Pt was given intensive diabetes education in hospital with insulin administration instructions, etc.  Pt demonstrated that she could give insulin.  Pt also noted to be motivated to take better  care of self.  Pt will stay with mother for next week and follow up with PCP next week.  Pt asked patient to avoid concentrated sweets, snacking between meals, etc.  Pt verbalized understanding.  Pt asked to check blood sugar 4 times per day.  Pt counseled on hypoglycemia.   3. Intertrigo - severe involving genitals and inner thigh - fluconazole given and clotrimazole vaginal given.     CBG (last 3)   Recent Labs  08/03/16 0627 08/03/16 0738 08/03/16 1203  GLUCAP 159* 185* 249*    DVT Prophylaxis- Lovenox    Family Communication:Admission, patients condition and plan of care including tests being ordered have been discussed with the patient and her mother at bedsidewho indicate understanding and agree with the plan and Code Status.  Code Status:Full code  Discharge Diagnoses:  Active Problems:   Cellulitis   Breast abscess   Diabetes mellitus (HCC)   Intertrigo   Uncontrolled diabetes mellitus (HCC)  Discharge Instructions  Discharge Instructions    Diet Carb Modified    Complete by:  As directed    Discharge instructions    Complete by:  As directed    Check blood sugars 4 times per day.  Call your doctor for any blood sugars less than 100.  Follow up with your doctor next week.   Increase activity slowly    Complete by:  As directed        Medication List    STOP taking these medications   clindamycin 150 MG capsule Commonly known as:  CLEOCIN   doxycycline 100 MG capsule Commonly known as:  VIBRAMYCIN   naproxen 500 MG tablet Commonly known as:  NAPROSYN   ondansetron 4 MG tablet Commonly known as:  ZOFRAN     TAKE these medications   CHLORHEXIDINE GLUCONATE (BULK) Soln Apply while bathing/showering.  Cover skin and cleanse/scrub gently.  Rinse off.  Use until gone.   clotrimazole 1 % vaginal cream Commonly known as:  GYNE-LOTRIMIN Place 1 Applicatorful vaginally at bedtime.   ibuprofen 200 MG tablet Commonly known as:  ADVIL,MOTRIN Take  400 mg by mouth every 6 (six) hours as needed for headache.   insulin NPH-regular Human (70-30) 100 UNIT/ML injection Commonly known as:  NOVOLIN 70/30 Inject 25 Units into the skin 2 (two) times daily with a meal.   INSULIN SYRINGE .3CC/31GX5/16" 31G X 5/16" 0.3 ML Misc Use as directed   metFORMIN 500 MG 24 hr tablet Commonly known as:  GLUCOPHAGE-XR Take 1 tablet (500 mg total) by mouth 2 (two) times daily with a meal.   oxyCODONE 5 MG immediate release tablet Commonly known as:  Oxy IR/ROXICODONE Take 0.5-1 tablets (2.5-5 mg total) by mouth every 6 (six) hours as needed for severe pain.   sulfamethoxazole-trimethoprim 800-160 MG tablet Commonly known as:  BACTRIM DS,SEPTRA DS Take 1 tablet by mouth 2 (two) times daily.      Follow-up Information    Berna Buehelsea A Connor, MD. Go today.   Specialty:  General Surgery Why:  Your appointment is 08/16/2016 at 11am. Please arrive 30 minutes early to fill out necessary paperwork. Contact information: [phone]        [facility] .   Why:  HH nursing, Quarry managersocial worker Contact information: [address] [place] [address] [phone]          Allergies  Allergen Reactions  . Peanut-Containing Drug Products Rash  . Tylenol [Acetaminophen] Nausea And Vomiting and Rash    Rash on chest   Procedures/Studies: [place] Breast Complete Uni Right Inc Axilla  Result Date: 07/31/2016 CLINICAL DATA:  History of diabetes with recent nipple piercing, now with concern for right breast abscess. EXAM: CHEST ULTRASOUND COMPARISON:  None. FINDINGS: Provided grayscale images demonstrate an approximately 10.2 x 4.8 x 4.7 cm serpiginous complex fluid collection (though note, exact measurements are difficult secondary to the irregular shape and borders) within the inferior medial aspect of the right breast, compatible with the patient's most painful area of concern and worrisome for a breast abscess. These findings were  confirmed with real-time sonographic evaluation performed by the dictating interventional radiologist. IMPRESSION: Concern for an at least 10.2 cm abscess within the inferior medial aspect of the right breast. Above findings were discussed with Dr. Fredricka Bonineonnor at the time of examination completion. Electronically Signed   By: Simonne ComeJohn  Watts M.D.   On: 07/31/2016 15:04     Subjective: Pt reports some constipation, otherwise motivated to go home.    Discharge Exam: Vitals:   08/02/16 2231 08/03/16 0555  BP: 127/61 122/80  Pulse: 95 89  Resp: 18 18  Temp: 98.5 F (36.9 C) 98 F (36.7 C)   Vitals:   08/02/16 0853 08/02/16 1357 08/02/16 2231 08/03/16 0555  BP: 135/90 129/74 127/61 122/80  Pulse: 99 92 95 89  Resp: 14 16 18 18   Temp: 97.9 F (36.6 C) 98.1 F (36.7 C) 98.5 F (36.9 C) 98 F (36.7 C)  TempSrc: Oral Oral Oral Oral  SpO2: 96% 97% 98% 99%  Weight:      Height:       General: Pt is alert, awake, not in acute distress Cardiovascular: RRR, S1/S2 +,  no rubs, no gallops Respiratory: CTA bilaterally, no wheezing, no rhonchi Abdominal: Soft, NT, ND, bowel sounds + Extremities: no edema, no cyanosis   The results of significant diagnostics from this hospitalization (including imaging, microbiology, ancillary and laboratory) are listed below for reference.     Microbiology: Recent Results (from the past 240 hour(s))  Aerobic/Anaerobic Culture (surgical/deep wound)     Status: None   Collection Time: 07/25/16  4:42 AM  Result Value Ref Range Status   Specimen Description ABSCESS BREAST  Final   Special Requests NONE  Final   Gram Stain   Final    ABUNDANT WBC PRESENT,BOTH PMN AND MONONUCLEAR ABUNDANT GRAM POSITIVE COCCI IN PAIRS ABUNDANT GRAM VARIABLE ROD    Culture   Final    ABUNDANT METHICILLIN RESISTANT STAPHYLOCOCCUS AUREUS MODERATE ESCHERICHIA COLI FEW KLEBSIELLA PNEUMONIAE NO ANAEROBES ISOLATED Performed at [facility]    Report Status 07/30/2016 FINAL   Final   Organism ID, Bacteria METHICILLIN RESISTANT STAPHYLOCOCCUS AUREUS  Final   Organism ID, Bacteria ESCHERICHIA COLI  Final   Organism ID, Bacteria KLEBSIELLA PNEUMONIAE  Final      Susceptibility   Escherichia coli - MIC*    AMPICILLIN >=32 RESISTANT Resistant     CEFAZOLIN <=4 SENSITIVE Sensitive     CEFEPIME <=1 SENSITIVE Sensitive     CEFTAZIDIME <=1 SENSITIVE Sensitive     CEFTRIAXONE <=1 SENSITIVE Sensitive     CIPROFLOXACIN <=0.25 SENSITIVE Sensitive     GENTAMICIN <=1 SENSITIVE Sensitive     IMIPENEM <=0.25 SENSITIVE Sensitive     TRIMETH/SULFA <=20 SENSITIVE Sensitive     AMPICILLIN/SULBACTAM 4 SENSITIVE Sensitive     PIP/TAZO <=4 SENSITIVE Sensitive     Extended ESBL NEGATIVE Sensitive     * MODERATE ESCHERICHIA COLI   Klebsiella pneumoniae - MIC*    AMPICILLIN 8 RESISTANT Resistant     CEFAZOLIN <=4 SENSITIVE Sensitive     CEFEPIME <=1 SENSITIVE Sensitive     CEFTAZIDIME <=1 SENSITIVE Sensitive     CEFTRIAXONE <=1 SENSITIVE Sensitive     CIPROFLOXACIN <=0.25 SENSITIVE Sensitive     GENTAMICIN <=1 SENSITIVE Sensitive     IMIPENEM <=0.25 SENSITIVE Sensitive     TRIMETH/SULFA <=20 SENSITIVE Sensitive     AMPICILLIN/SULBACTAM 4 SENSITIVE Sensitive     PIP/TAZO <=4 SENSITIVE Sensitive     Extended ESBL NEGATIVE Sensitive     * FEW KLEBSIELLA PNEUMONIAE   Methicillin resistant staphylococcus aureus - MIC*    CIPROFLOXACIN <=0.5 SENSITIVE Sensitive     ERYTHROMYCIN >=8 RESISTANT Resistant     GENTAMICIN <=0.5 SENSITIVE Sensitive     OXACILLIN >=4 RESISTANT Resistant     TETRACYCLINE <=1 SENSITIVE Sensitive     VANCOMYCIN 1 SENSITIVE Sensitive     TRIMETH/SULFA <=10 SENSITIVE Sensitive     CLINDAMYCIN >=8 RESISTANT Resistant     RIFAMPIN <=0.5 SENSITIVE Sensitive     Inducible Clindamycin NEGATIVE Sensitive     * ABUNDANT METHICILLIN RESISTANT STAPHYLOCOCCUS AUREUS  Blood culture (routine x 2)     Status: None (Preliminary result)   Collection Time: 07/31/16   3:19 AM  Result Value Ref Range Status   Specimen Description BLOOD LEFT HAND  Final   Special Requests BOTTLES DRAWN AEROBIC AND ANAEROBIC 5CC EACH  Final   Culture   Final    NO GROWTH 3 DAYS Performed at [facility]    Report Status PENDING  Incomplete  Blood culture (routine x 2)     Status: None (  Preliminary result)   Collection Time: 07/31/16  3:31 AM  Result Value Ref Range Status   Specimen Description BLOOD RIGHT ANTECUBITAL  Final   Special Requests BOTTLES DRAWN AEROBIC AND ANAEROBIC 5CC EACH  Final   Culture   Final    NO GROWTH 3 DAYS Performed at [facility]    Report Status PENDING  Incomplete  Urine culture     Status: Abnormal   Collection Time: 07/31/16  4:05 AM  Result Value Ref Range Status   Specimen Description URINE, RANDOM  Final   Special Requests NONE  Final   Culture MULTIPLE SPECIES PRESENT, SUGGEST RECOLLECTION (A)  Final   Report Status 08/01/2016 FINAL  Final  Surgical pcr screen     Status: Abnormal   Collection Time: 07/31/16  2:13 PM  Result Value Ref Range Status   MRSA, PCR NEGATIVE NEGATIVE Final   Staphylococcus aureus POSITIVE (A) NEGATIVE Final    Comment:        The Xpert SA Assay (FDA approved for NASAL specimens in patients over 37 years of age), is one component of a comprehensive surveillance program.  Test performance has been validated by [facility] for patients greater than or equal to 35 year old. It is not intended to diagnose infection nor to guide or monitor treatment.   Aerobic/Anaerobic Culture (surgical/deep wound)     Status: None (Preliminary result)   Collection Time: 07/31/16  4:22 PM  Result Value Ref Range Status   Specimen Description ABSCESS RIGHT BREAST  Final   Special Requests NONE  Final   Gram Stain   Final    MODERATE WBC PRESENT, PREDOMINANTLY PMN ABUNDANT GRAM POSITIVE COCCI IN PAIRS AND CHAINS FEW GRAM POSITIVE RODS RARE GRAM NEGATIVE RODS Performed at [facility]     Culture   Final    RARE METHICILLIN RESISTANT STAPHYLOCOCCUS AUREUS NO ANAEROBES ISOLATED; CULTURE IN PROGRESS FOR 5 DAYS    Report Status PENDING  Incomplete   Organism ID, Bacteria METHICILLIN RESISTANT STAPHYLOCOCCUS AUREUS  Final      Susceptibility   Methicillin resistant staphylococcus aureus - MIC*    CIPROFLOXACIN <=0.5 SENSITIVE Sensitive     ERYTHROMYCIN >=8 RESISTANT Resistant     GENTAMICIN <=0.5 SENSITIVE Sensitive     OXACILLIN >=4 RESISTANT Resistant     TETRACYCLINE <=1 SENSITIVE Sensitive     VANCOMYCIN 1 SENSITIVE Sensitive     TRIMETH/SULFA <=10 SENSITIVE Sensitive     CLINDAMYCIN >=8 RESISTANT Resistant     RIFAMPIN <=0.5 SENSITIVE Sensitive     Inducible Clindamycin NEGATIVE Sensitive     * RARE METHICILLIN RESISTANT STAPHYLOCOCCUS AUREUS  Fungus Stain     Status: None   Collection Time: 07/31/16  4:22 PM  Result Value Ref Range Status   FUNGUS STAIN Final report  Final    Comment: (NOTE) Performed At: [facility] [address] [place], [place] [id number] Mila Homer MD WU:[id number]    Fungal Source ABSCESS  Final    Comment: RIGHT BREAST   Fungal Stain reflex     Status: None   Collection Time: 07/31/16  4:22 PM  Result Value Ref Range Status   Fungal stain result 1 Comment  Final    Comment: (NOTE) KOH/Calcofluor preparation:  no fungus observed. Performed At: [facility] [address] [place], [place] [id number] Mila Homer MD VH:[id number]      Labs: BNP (last 3 results) No results for input(s): BNP in the last 8760  hours. Basic Metabolic Panel:  Recent Labs Lab 07/31/16 0313 08/01/16 0526 08/02/16 0508  NA 131* 134* 135  K 3.9 3.7 4.7  CL 94* 101 100*  CO2 26 27 28   GLUCOSE 444* 228* 337*  BUN 9 5* 9  CREATININE 0.53 0.44 0.56  CALCIUM 9.0 8.7* 8.8*   Liver Function Tests:  Recent Labs Lab 08/01/16 0526 08/02/16 0508  AST 11* 11*  ALT 9* 10*  ALKPHOS 76 64  BILITOT 0.4 0.2*  PROT 6.8  6.6  ALBUMIN 2.3* 2.2*   No results for input(s): LIPASE, AMYLASE in the last 168 hours. No results for input(s): AMMONIA in the last 168 hours. CBC:  Recent Labs Lab 07/31/16 0313 08/01/16 0526 08/02/16 0508 08/03/16 0731  WBC 22.0* 15.3* 11.4* 9.7  NEUTROABS 17.7*  --  6.9  --   HGB 11.4* 10.2* 10.6* 10.5*  HCT 34.5* 31.5* 32.1* 31.3*  MCV 80.2 81.6 78.9 78.8  PLT 377 357 394 437*   Cardiac Enzymes: No results for input(s): CKTOTAL, CKMB, CKMBINDEX, TROPONINI in the last 168 hours. BNP: Invalid input(s): POCBNP CBG:  Recent Labs Lab 08/02/16 1643 08/02/16 2228 08/03/16 0627 08/03/16 0738 08/03/16 1203  GLUCAP 175* 127* 159* 185* 249*   D-Dimer No results for input(s): DDIMER in the last 72 hours. Hgb A1c No results for input(s): HGBA1C in the last 72 hours. Lipid Profile No results for input(s): CHOL, HDL, LDLCALC, TRIG, CHOLHDL, LDLDIRECT in the last 72 hours. Thyroid function studies No results for input(s): TSH, T4TOTAL, T3FREE, THYROIDAB in the last 72 hours.  Invalid input(s): FREET3 Anemia work up No results for input(s): VITAMINB12, FOLATE, FERRITIN, TIBC, IRON, RETICCTPCT in the last 72 hours. Urinalysis    Component Value Date/Time   COLORURINE YELLOW 07/31/2016 0405   APPEARANCEUR CLEAR 07/31/2016 0405   LABSPEC >1.046 (H) 07/31/2016 0405   PHURINE 5.5 07/31/2016 0405   GLUCOSEU >1000 (A) 07/31/2016 0405   HGBUR NEGATIVE 07/31/2016 0405   BILIRUBINUR NEGATIVE 07/31/2016 0405   KETONESUR 40 (A) 07/31/2016 0405   PROTEINUR NEGATIVE 07/31/2016 0405   UROBILINOGEN 0.2 04/04/2014 2215   NITRITE NEGATIVE 07/31/2016 0405   LEUKOCYTESUR NEGATIVE 07/31/2016 0405   Sepsis Labs Invalid input(s): PROCALCITONIN,  WBC,  LACTICIDVEN Microbiology Recent Results (from the past 240 hour(s))  Aerobic/Anaerobic Culture (surgical/deep wound)     Status: None   Collection Time: 07/25/16  4:42 AM  Result Value Ref Range Status   Specimen Description ABSCESS  BREAST  Final   Special Requests NONE  Final   Gram Stain   Final    ABUNDANT WBC PRESENT,BOTH PMN AND MONONUCLEAR ABUNDANT GRAM POSITIVE COCCI IN PAIRS ABUNDANT GRAM VARIABLE ROD    Culture   Final    ABUNDANT METHICILLIN RESISTANT STAPHYLOCOCCUS AUREUS MODERATE ESCHERICHIA COLI FEW KLEBSIELLA PNEUMONIAE NO ANAEROBES ISOLATED Performed at [facility]    Report Status 07/30/2016 FINAL  Final   Organism ID, Bacteria METHICILLIN RESISTANT STAPHYLOCOCCUS AUREUS  Final   Organism ID, Bacteria ESCHERICHIA COLI  Final   Organism ID, Bacteria KLEBSIELLA PNEUMONIAE  Final      Susceptibility   Escherichia coli - MIC*    AMPICILLIN >=32 RESISTANT Resistant     CEFAZOLIN <=4 SENSITIVE Sensitive     CEFEPIME <=1 SENSITIVE Sensitive     CEFTAZIDIME <=1 SENSITIVE Sensitive     CEFTRIAXONE <=1 SENSITIVE Sensitive     CIPROFLOXACIN <=0.25 SENSITIVE Sensitive     GENTAMICIN <=1 SENSITIVE Sensitive     IMIPENEM <=0.25 SENSITIVE  Sensitive     TRIMETH/SULFA <=20 SENSITIVE Sensitive     AMPICILLIN/SULBACTAM 4 SENSITIVE Sensitive     PIP/TAZO <=4 SENSITIVE Sensitive     Extended ESBL NEGATIVE Sensitive     * MODERATE ESCHERICHIA COLI   Klebsiella pneumoniae - MIC*    AMPICILLIN 8 RESISTANT Resistant     CEFAZOLIN <=4 SENSITIVE Sensitive     CEFEPIME <=1 SENSITIVE Sensitive     CEFTAZIDIME <=1 SENSITIVE Sensitive     CEFTRIAXONE <=1 SENSITIVE Sensitive     CIPROFLOXACIN <=0.25 SENSITIVE Sensitive     GENTAMICIN <=1 SENSITIVE Sensitive     IMIPENEM <=0.25 SENSITIVE Sensitive     TRIMETH/SULFA <=20 SENSITIVE Sensitive     AMPICILLIN/SULBACTAM 4 SENSITIVE Sensitive     PIP/TAZO <=4 SENSITIVE Sensitive     Extended ESBL NEGATIVE Sensitive     * FEW KLEBSIELLA PNEUMONIAE   Methicillin resistant staphylococcus aureus - MIC*    CIPROFLOXACIN <=0.5 SENSITIVE Sensitive     ERYTHROMYCIN >=8 RESISTANT Resistant     GENTAMICIN <=0.5 SENSITIVE Sensitive     OXACILLIN >=4 RESISTANT Resistant      TETRACYCLINE <=1 SENSITIVE Sensitive     VANCOMYCIN 1 SENSITIVE Sensitive     TRIMETH/SULFA <=10 SENSITIVE Sensitive     CLINDAMYCIN >=8 RESISTANT Resistant     RIFAMPIN <=0.5 SENSITIVE Sensitive     Inducible Clindamycin NEGATIVE Sensitive     * ABUNDANT METHICILLIN RESISTANT STAPHYLOCOCCUS AUREUS  Blood culture (routine x 2)     Status: None (Preliminary result)   Collection Time: 07/31/16  3:19 AM  Result Value Ref Range Status   Specimen Description BLOOD LEFT HAND  Final   Special Requests BOTTLES DRAWN AEROBIC AND ANAEROBIC 5CC EACH  Final   Culture   Final    NO GROWTH 3 DAYS Performed at [facility]    Report Status PENDING  Incomplete  Blood culture (routine x 2)     Status: None (Preliminary result)   Collection Time: 07/31/16  3:31 AM  Result Value Ref Range Status   Specimen Description BLOOD RIGHT ANTECUBITAL  Final   Special Requests BOTTLES DRAWN AEROBIC AND ANAEROBIC 5CC EACH  Final   Culture   Final    NO GROWTH 3 DAYS Performed at [facility]    Report Status PENDING  Incomplete  Urine culture     Status: Abnormal   Collection Time: 07/31/16  4:05 AM  Result Value Ref Range Status   Specimen Description URINE, RANDOM  Final   Special Requests NONE  Final   Culture MULTIPLE SPECIES PRESENT, SUGGEST RECOLLECTION (A)  Final   Report Status 08/01/2016 FINAL  Final  Surgical pcr screen     Status: Abnormal   Collection Time: 07/31/16  2:13 PM  Result Value Ref Range Status   MRSA, PCR NEGATIVE NEGATIVE Final   Staphylococcus aureus POSITIVE (A) NEGATIVE Final    Comment:        The Xpert SA Assay (FDA approved for NASAL specimens in patients over 63 years of age), is one component of a comprehensive surveillance program.  Test performance has been validated by [facility] for patients greater than or equal to 60 year old. It is not intended to diagnose infection nor to guide or monitor treatment.   Aerobic/Anaerobic Culture  (surgical/deep wound)     Status: None (Preliminary result)   Collection Time: 07/31/16  4:22 PM  Result Value Ref Range Status   Specimen Description ABSCESS RIGHT BREAST  Final   Special Requests NONE  Final   Gram Stain   Final    MODERATE WBC PRESENT, PREDOMINANTLY PMN ABUNDANT GRAM POSITIVE COCCI IN PAIRS AND CHAINS FEW GRAM POSITIVE RODS RARE GRAM NEGATIVE RODS Performed at [facility]    Culture   Final    RARE METHICILLIN RESISTANT STAPHYLOCOCCUS AUREUS NO ANAEROBES ISOLATED; CULTURE IN PROGRESS FOR 5 DAYS    Report Status PENDING  Incomplete   Organism ID, Bacteria METHICILLIN RESISTANT STAPHYLOCOCCUS AUREUS  Final      Susceptibility   Methicillin resistant staphylococcus aureus - MIC*    CIPROFLOXACIN <=0.5 SENSITIVE Sensitive     ERYTHROMYCIN >=8 RESISTANT Resistant     GENTAMICIN <=0.5 SENSITIVE Sensitive     OXACILLIN >=4 RESISTANT Resistant     TETRACYCLINE <=1 SENSITIVE Sensitive     VANCOMYCIN 1 SENSITIVE Sensitive     TRIMETH/SULFA <=10 SENSITIVE Sensitive     CLINDAMYCIN >=8 RESISTANT Resistant     RIFAMPIN <=0.5 SENSITIVE Sensitive     Inducible Clindamycin NEGATIVE Sensitive     * RARE METHICILLIN RESISTANT STAPHYLOCOCCUS AUREUS  Fungus Stain     Status: None   Collection Time: 07/31/16  4:22 PM  Result Value Ref Range Status   FUNGUS STAIN Final report  Final    Comment: (NOTE) Performed At: [facility] [address] [place], [place] [id number] Mila Homer MD WU:[id number]    Fungal Source ABSCESS  Final    Comment: RIGHT BREAST   Fungal Stain reflex     Status: None   Collection Time: 07/31/16  4:22 PM  Result Value Ref Range Status   Fungal stain result 1 Comment  Final    Comment: (NOTE) KOH/Calcofluor preparation:  no fungus observed. Performed At: [facility] [address] [place], [place] [id number] Mila Homer MD VH:[id number]    Time coordinating discharge: 32 minutes  SIGNED:  Standley Dakins, MD  [facility] 08/03/2016, 1:22 PM Pager   If 7PM-7AM, please contact night-coverage [email or web address] Password [email or web address]

## 2016-08-03 NOTE — Care Management Note (Signed)
Case Management Note  Patient Details  Name: Altamese CabalKelly Saenz MRN: [id number] Date of Birth: 1991/01/04  Subjective/Objective: Patient will d/c home today with mother, but will say @ this [address] tomorrow going forward[address] [place], [phone] [phone].                   Action/Plan:d/c home w/HHC.   Expected Discharge Date:                  Expected Discharge Plan:  Home w Home Health Services  In-House Referral:     Discharge planning Services  [facility]  Post Acute Care Choice:  Durable Medical Equipment (glucometer) Choice offered to:  Patient  DME Arranged:    DME Agency:     HH Arranged:  RN, Social Work [employer] [facility]  [facility]  Status of Service:  Completed, signed off  If discussed at [employer] Meetings, dates discussed:    Additional Comments:  Lanier ClamMahabir, Ieesha Abbasi, RN 08/03/2016, 12:33 PM

## 2016-08-03 NOTE — Progress Notes (Signed)
Pt competently self administered insulin injection into upper posterior of left arm under this writer's supervision. Pt verbalizes comfort w/ self administration of insulin.

## 2016-08-05 LAB — CULTURE, BLOOD (ROUTINE X 2)
CULTURE: NO GROWTH
Culture: NO GROWTH

## 2016-08-05 LAB — AEROBIC/ANAEROBIC CULTURE (SURGICAL/DEEP WOUND)

## 2016-08-05 LAB — AEROBIC/ANAEROBIC CULTURE W GRAM STAIN (SURGICAL/DEEP WOUND)

## 2017-03-21 ENCOUNTER — Emergency Department (HOSPITAL_COMMUNITY)
Admission: EM | Admit: 2017-03-21 | Discharge: 2017-03-21 | Disposition: A | Discharge status: Home / Self Care | Payer: Self-pay | Attending: Emergency Medicine | Admitting: Emergency Medicine

## 2017-03-21 ENCOUNTER — Encounter (HOSPITAL_COMMUNITY): Payer: Self-pay

## 2017-03-21 DIAGNOSIS — Z91010 Allergy to peanuts: Secondary | ICD-10-CM | POA: Insufficient documentation

## 2017-03-21 DIAGNOSIS — E119 Type 2 diabetes mellitus without complications: Secondary | ICD-10-CM | POA: Insufficient documentation

## 2017-03-21 DIAGNOSIS — Z794 Long term (current) use of insulin: Secondary | ICD-10-CM | POA: Insufficient documentation

## 2017-03-21 DIAGNOSIS — J029 Acute pharyngitis, unspecified: Secondary | ICD-10-CM | POA: Insufficient documentation

## 2017-03-21 HISTORY — DX: Methicillin resistant Staphylococcus aureus infection, unspecified site: A49.02

## 2017-03-21 LAB — RAPID STREP SCREEN (MED CTR MEBANE ONLY): STREPTOCOCCUS, GROUP A SCREEN (DIRECT): NEGATIVE

## 2017-03-21 MED ORDER — IBUPROFEN 800 MG PO TABS
800.0000 mg | ORAL_TABLET | Freq: Once | ORAL | Status: AC
  Administered 2017-03-21: 800 mg via ORAL
  Filled 2017-03-21: qty 1

## 2017-03-21 MED ORDER — CETIRIZINE HCL 10 MG PO CAPS: 10.0000 mg | ORAL_CAPSULE | Freq: Every day | ORAL | 0 refills | Status: DC

## 2017-03-21 NOTE — Discharge Instructions (Signed)
Your strep test was negative. Your ears showed no sign of bacterial infection. I think this is likely a virus or allergies causing her symptoms. We are starting you on an antihistamine. You may alternate Tylenol and Motrin for pain. You do not need antibiotics at this time.   You may alternate Tylenol 1000 mg every 6 hours as needed for pain and Ibuprofen 800 mg every 8 hours as needed for pain.  Please take Ibuprofen with food.    To find a primary care or specialty doctor please call [phone] or [phone] to access "[facility] Find a Doctor Service."  You may also go on the [facility] website at [email or web address]  There are also multiple [facility], [facility], [facility] and [facility] practices throughout [place] that are frequently accepting new patients. You may find a clinic that is close to your home and contact them.  [facility] -  [address] [place] [phone]   [facility] -  [address] [place] [id number] [phone]   [facility] [phone] [address]  [place] [phone]

## 2017-03-21 NOTE — ED Provider Notes (Signed)
TIME SEEN: 3:41 AM  By signing my name below, I, [name], attest that this documentation has been prepared under the direction and in the presence of [name], Delice Bison, DO. Electronically Signed: [name], Scribe. 03/21/17. 3:43 AM.  CHIEF COMPLAINT: Sore throat  HPI:  Elizabeth Blackburn is a 26 y.o. female with a history of DM, who presents to the Emergency Department complaining of sudden-onset, persistent sore throat that began 3 days ago. Patient reports a gradually worsening sore throat for the past few days. Patient reports associated left ear pain. Patient reports taking ibuprofen, last dose at 8 pm with no relief. Patient states her sore throat is worse with swallowing. Patient is able to tolerate secretions. Patient denies any fever, chills, cough, nausea, vomiting, or diarrhea.  No headache or neck pain, neck stiffness. No rash.  ROS: See HPI Constitutional: no fever  Eyes: no drainage  ENT: no runny nose   Cardiovascular:  no chest pain  Resp: no SOB  GI: no vomiting GU: no dysuria Integumentary: no rash  Allergy: no hives  Musculoskeletal: no leg swelling  Neurological: no slurred speech ROS otherwise negative  PAST MEDICAL HISTORY/PAST SURGICAL HISTORY:  Past Medical History:  Diagnosis Date  . Diabetes mellitus without complication ([place])   . MRSA (methicillin resistant Staphylococcus aureus)     MEDICATIONS:  Prior to Admission medications   Medication Sig Start Date End Date Taking? Authorizing Provider  blood glucose meter kit and supplies Dispense based on patient and insurance preference. Use up to four times daily as directed. (FOR ICD-9 250.00, 250.01). 08/03/16   Johnson, Clanford L, MD  CHLORHEXIDINE GLUCONATE, BULK, SOLN Apply while bathing/showering.  Cover skin and cleanse/scrub gently.  Rinse off.  Use until gone. Patient not taking: Reported on 07/31/2016 05/04/16   Montine Circle, PA-C  clotrimazole (GYNE-LOTRIMIN) 1 % vaginal cream Place 1  Applicatorful vaginally at bedtime. 08/03/16   Johnson, Clanford L, MD  ibuprofen (ADVIL,MOTRIN) 200 MG tablet Take 400 mg by mouth every 6 (six) hours as needed for headache.    [provider]  insulin NPH-regular Human (NOVOLIN 70/30) (70-30) 100 UNIT/ML injection Inject 25 Units into the skin 2 (two) times daily with a meal. 08/03/16   Johnson, Clanford L, MD  Insulin Syringe-Needle U-100 (INSULIN SYRINGE .3CC/31GX5/16") 31G X 5/16" 0.3 ML MISC Use as directed 08/03/16   Wynetta Emery, Clanford L, MD  metFORMIN (GLUCOPHAGE-XR) 500 MG 24 hr tablet Take 1 tablet (500 mg total) by mouth 2 (two) times daily with a meal. 08/03/16   Johnson, Clanford L, MD  oxyCODONE (OXY IR/ROXICODONE) 5 MG immediate release tablet Take 0.5-1 tablets (2.5-5 mg total) by mouth every 6 (six) hours as needed for severe pain. Patient not taking: Reported on 07/31/2016 04/20/16   Margarita Mail, PA-C    ALLERGIES:  Allergies  Allergen Reactions  . Peanut-Containing Drug Products Rash  . Tylenol [Acetaminophen] Nausea And Vomiting and Rash    Rash on chest    SOCIAL HISTORY:  Social History  Substance Use Topics  . Smoking status: Never Smoker  . Smokeless tobacco: Never Used  . Alcohol use Yes    FAMILY HISTORY: History reviewed. No pertinent family history.  EXAM: BP (!) 152/103 (BP Location: Left Arm)   Pulse (!) 103   Temp 98.3 F (36.8 C) (Oral)   Resp 18   SpO2 99%  CONSTITUTIONAL: Alert and oriented and responds appropriately to questions. Well-appearing; well-nourished, Afebrile, nontoxic HEAD: Normocephalic EYES: Conjunctivae clear, pupils appear equal,  EOMI ENT: normal nose; moist mucous membranes; No pharyngeal erythema or petechiae, no tonsillar hypertrophy or exudate, no uvular deviation, no unilateral swelling, no trismus or drooling, no muffled voice, normal phonation, no stridor, no dental caries present, no drainable dental abscess noted, no Ludwig's angina, tongue sits flat in the  bottom of the mouth, no angioedema, no facial erythema or warmth, no facial swelling; no pain with movement of the neck.  TMs are clear bilaterally without erythema, purulence, bulging, perforation, effusion.  No cerumen impaction or sign of foreign body in the external auditory canal. No inflammation, erythema or drainage from the external auditory canal. No signs of mastoiditis. No pain with manipulation of the pinna bilaterally. NECK: Supple, no meningismus, no nuchal rigidity, no LAD  CARD: Regular and minimally tachycardic; S1 and S2 appreciated; no murmurs, no clicks, no rubs, no gallops RESP: Normal chest excursion without splinting or tachypnea; breath sounds clear and equal bilaterally; no wheezes, no rhonchi, no rales, no hypoxia or respiratory distress, speaking full sentences ABD/GI: Normal bowel sounds; non-distended; soft, mildly TTP diffusely, no rebound, no guarding, no peritoneal signs, no hepatosplenomegaly BACK:  The back appears normal and is non-tender to palpation, there is no CVA tenderness EXT: Normal ROM in all joints; non-tender to palpation; no edema; normal capillary refill; no cyanosis, no calf tenderness or swelling    SKIN: Normal color for age and race; warm; no rash NEURO: Moves all extremities equally PSYCH: The patient's mood and manner are appropriate. Grooming and personal hygiene are appropriate.  MEDICAL DECISION MAKING: Patient here with pharyngitis and symptoms of viral URI. Strep swab is negative. No sign of deep space neck infection, peritonsillar abscess on exam. Doubt meningitis, pneumonia. She has no sign of otitis media, otitis externa or mastoiditis on exam. At this time I do not feel she needs to be on antibiotics. I have discussed supportive care measures with her. Recommended alternating Tylenol and Motrin for pain. Given dose of ibuprofen here.  At this time, I do not feel there is any life-threatening condition present. I have reviewed and discussed  all results (EKG, imaging, lab, urine as appropriate) and exam findings with patient/family. I have reviewed nursing notes and appropriate previous records.  I feel the patient is safe to be discharged home without further emergent workup and can continue workup as an outpatient as needed. Discussed usual and customary return precautions. Patient/family verbalize understanding and are comfortable with this plan.  Outpatient follow-up has been provided if needed. All questions have been answered.  I personally performed the services described in this documentation, which was scribed in my presence. The recorded information has been reviewed and is accurate.     Gedalya Jim, Delice Bison, DO 03/21/17 [phone]

## 2017-03-21 NOTE — ED Triage Notes (Signed)
sorethroat and left ear pain x 3 days no drooling or horseness noted no fever voiced non productive cough noted able to speak in full sentences.

## 2017-03-23 LAB — CULTURE, GROUP A STREP (THRC)

## 2017-05-18 IMAGING — US US BREAST*R* COMPLETE INC AXILLA
2 series · 14 of 25 positions shown · non-contrast
Comparison: None.

CLINICAL DATA: History of diabetes with recent nipple piercing, now
with concern for right breast abscess.

EXAM:
CHEST ULTRASOUND

[Series 1: us breast*right* complete inc axilla · 0.13mm/px · 13 of 27 slices shown (1 of 2)]
[im 1/27]
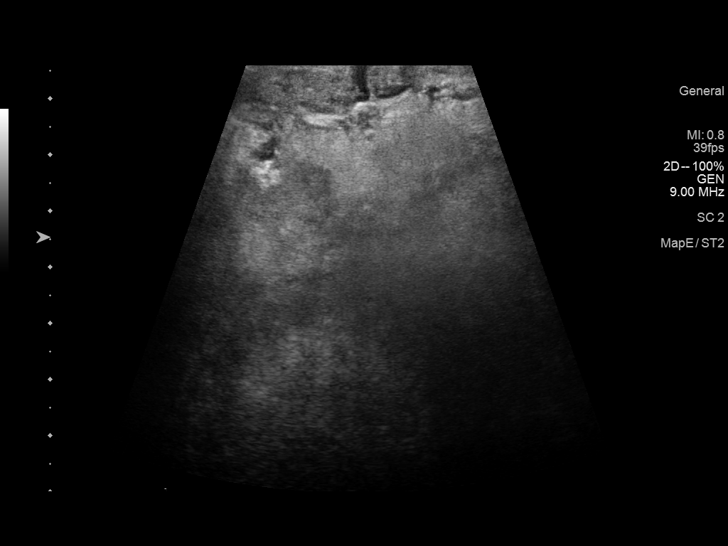
[im 3/27]
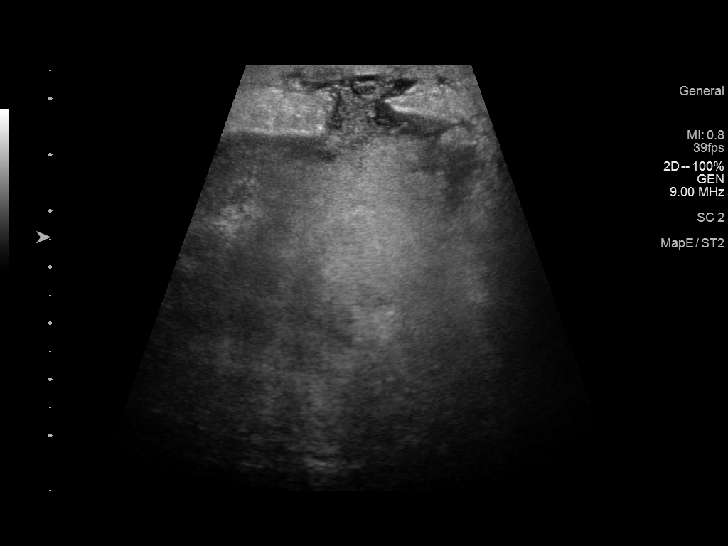
[im 5/27]
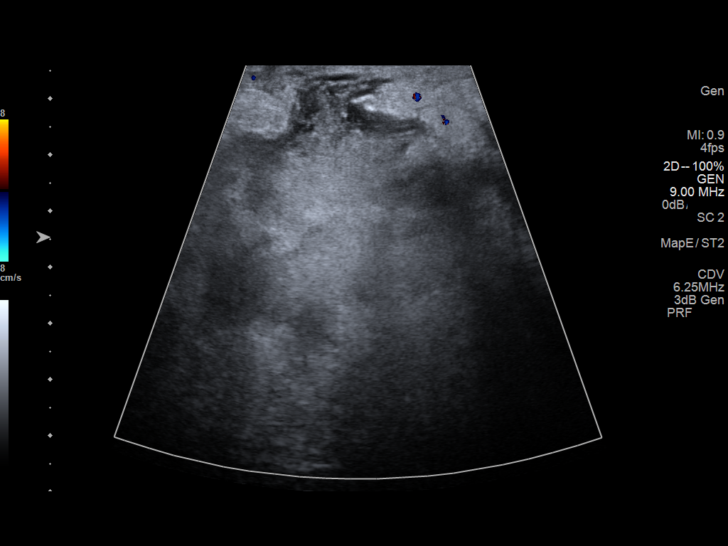
[im 8/27]
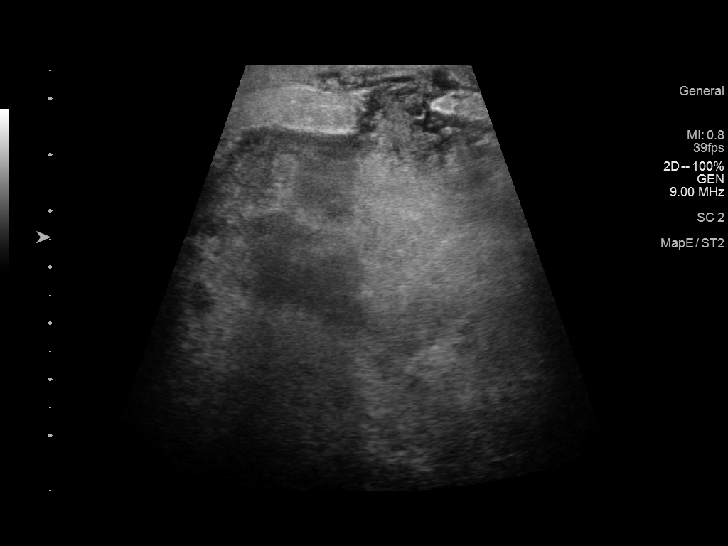
[im 10/27]
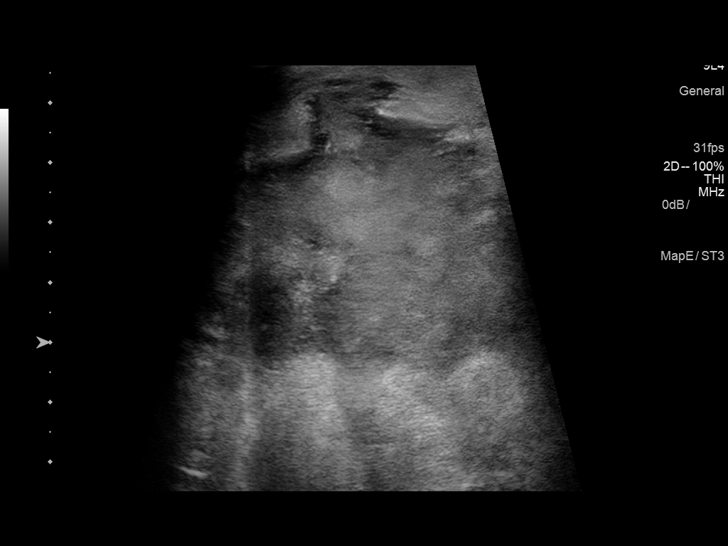
[im 11/27]
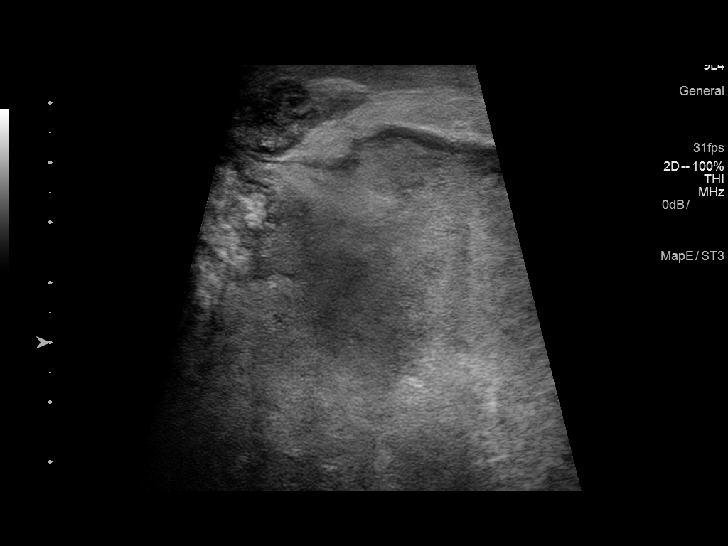
[im 14/27]
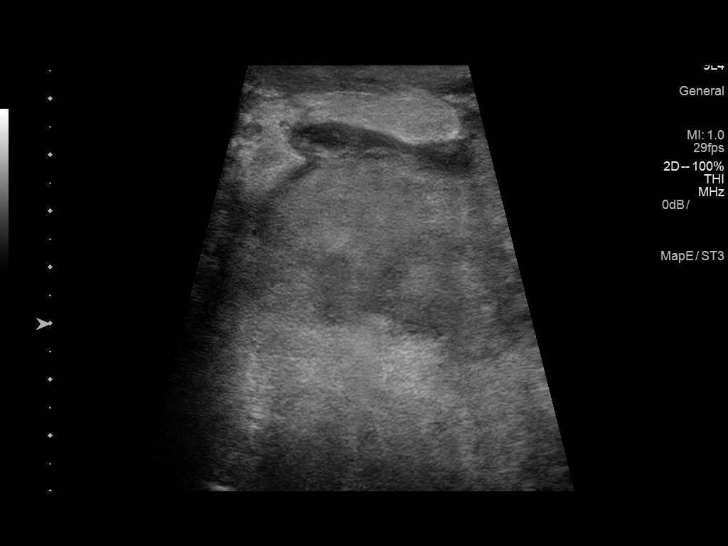
[im 16/27]
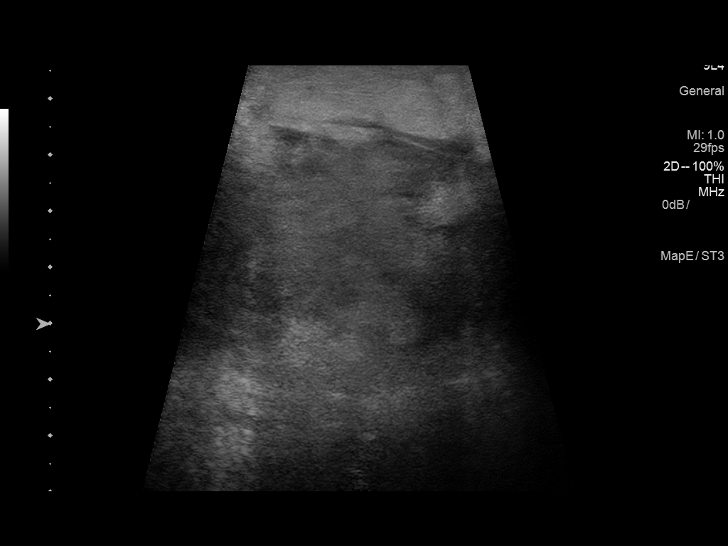
[im 18/27]
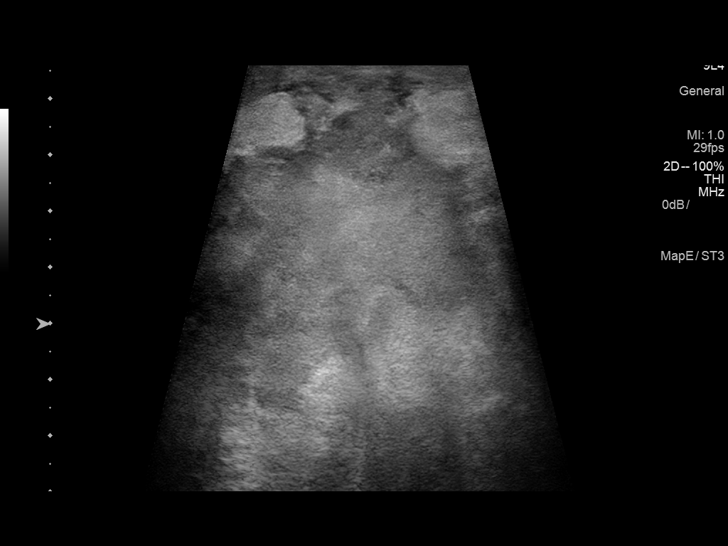
[im 19/27]
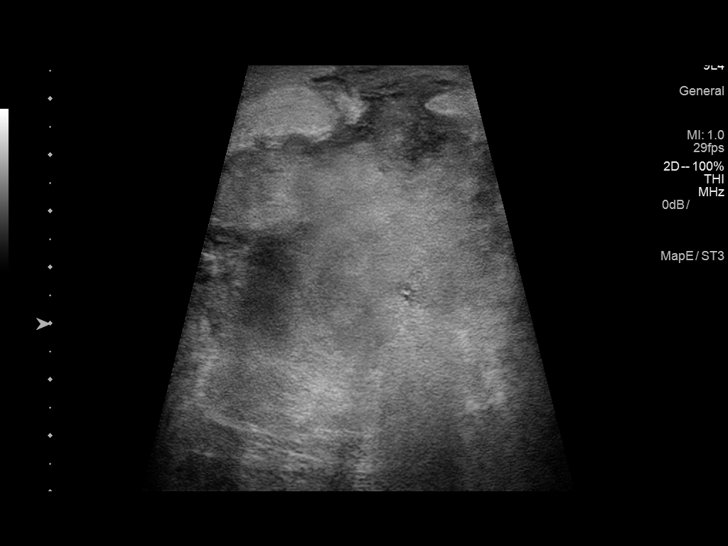
[im 22/27]
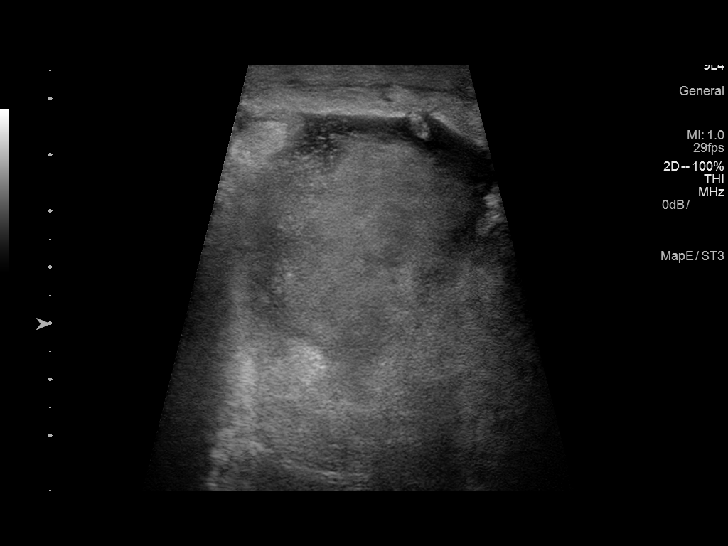
[im 24/27]
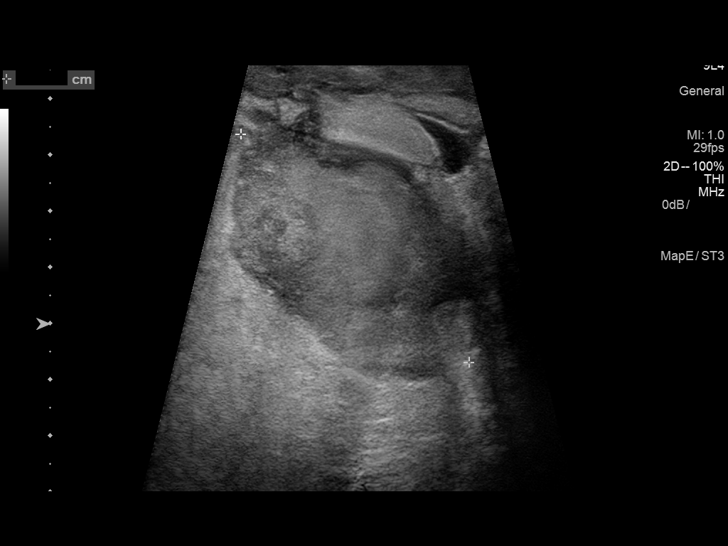
[im 27/27]
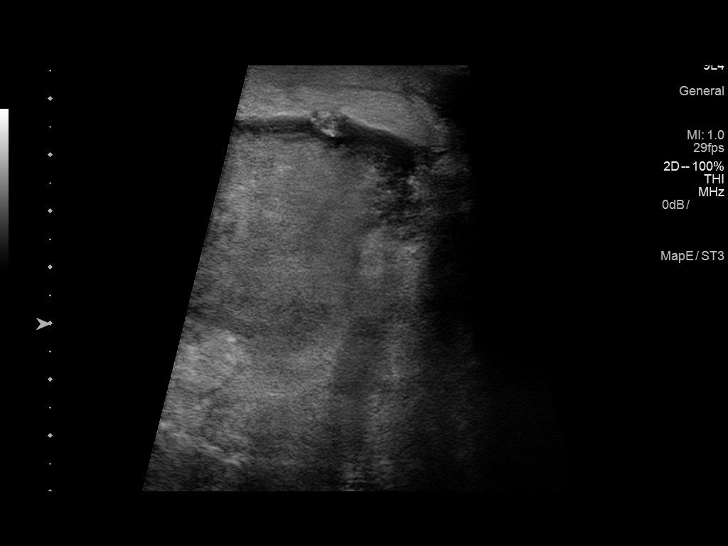

[Series 2: us breast*right* complete inc axilla · 0.11mm/px · 1 of 2 slices shown (2 of 2)]
[im 2/2]
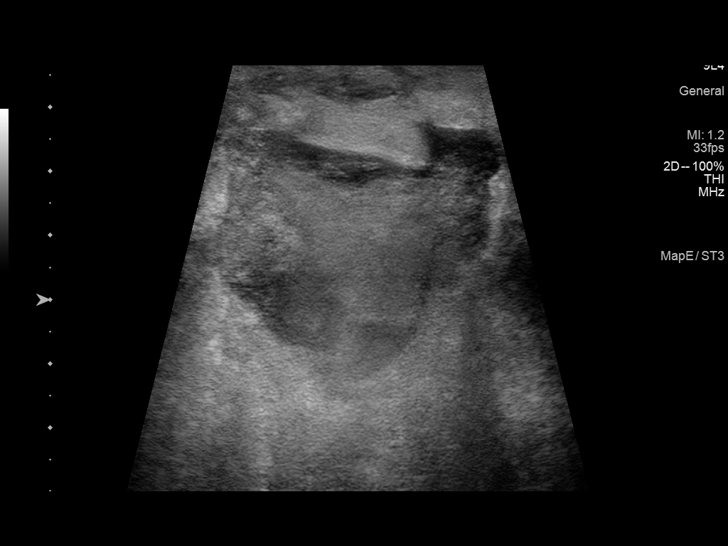

[14 of 25 positions shown; findings below may reference images not displayed]

FINDINGS: Provided grayscale images demonstrate an approximately 10.2 x 4.8 x
4.7 cm serpiginous complex fluid collection (though note, exact
measurements are difficult secondary to the irregular shape and
borders) within the inferior medial aspect of the right breast,
compatible with the patient's most painful area of concern and
worrisome for a breast abscess.

These findings were confirmed with real-time sonographic evaluation
performed by the dictating interventional radiologist.
IMPRESSION: Concern for an at least 10.2 cm abscess within the inferior medial
aspect of the right breast.

Above findings were discussed with Dr. Kotiah at the time of
examination completion.

## 2017-05-19 ENCOUNTER — Emergency Department (HOSPITAL_COMMUNITY)
Admission: EM | Admit: 2017-05-19 | Discharge: 2017-05-19 | Disposition: A | Discharge status: Home / Self Care | Payer: Self-pay | Attending: Emergency Medicine | Admitting: Emergency Medicine

## 2017-05-19 ENCOUNTER — Encounter (HOSPITAL_COMMUNITY): Payer: Self-pay

## 2017-05-19 DIAGNOSIS — Z79899 Other long term (current) drug therapy: Secondary | ICD-10-CM | POA: Insufficient documentation

## 2017-05-19 DIAGNOSIS — Z91010 Allergy to peanuts: Secondary | ICD-10-CM | POA: Insufficient documentation

## 2017-05-19 DIAGNOSIS — E119 Type 2 diabetes mellitus without complications: Secondary | ICD-10-CM | POA: Insufficient documentation

## 2017-05-19 DIAGNOSIS — Z794 Long term (current) use of insulin: Secondary | ICD-10-CM | POA: Insufficient documentation

## 2017-05-19 DIAGNOSIS — R112 Nausea with vomiting, unspecified: Secondary | ICD-10-CM | POA: Insufficient documentation

## 2017-05-19 LAB — URINALYSIS, ROUTINE W REFLEX MICROSCOPIC
BACTERIA UA: NONE SEEN
Bilirubin Urine: NEGATIVE
Glucose, UA: >=500 mg/dL — AB
Hgb urine dipstick: NEGATIVE
KETONES UR: NEGATIVE mg/dL
LEUKOCYTES UA: NEGATIVE
Nitrite: NEGATIVE
PH: 5 (ref 5.0–8.0)
Protein, ur: NEGATIVE mg/dL
Specific Gravity, Urine: 1.035 — ABNORMAL HIGH (ref 1.005–1.030)

## 2017-05-19 LAB — COMPREHENSIVE METABOLIC PANEL
ALK PHOS: 67 U/L (ref 38–126)
ALT: 12 U/L — AB (ref 14–54)
AST: 16 U/L (ref 15–41)
Albumin: 3.6 g/dL (ref 3.5–5.0)
Anion gap: 11 (ref 5–15)
BUN: 9 mg/dL (ref 6–20)
CHLORIDE: 100 mmol/L — AB (ref 101–111)
CO2: 28 mmol/L (ref 22–32)
Calcium: 9.4 mg/dL (ref 8.9–10.3)
Creatinine, Ser: 0.63 mg/dL (ref 0.44–1.00)
GFR calc non Af Amer: >60 mL/min (ref >60)
GLUCOSE: 428 mg/dL — AB (ref 65–99)
Potassium: 3.7 mmol/L (ref 3.5–5.1)
SODIUM: 139 mmol/L (ref 135–145)
Total Bilirubin: <0.1 mg/dL — ABNORMAL LOW (ref 0.3–1.2)
Total Protein: 7.5 g/dL (ref 6.5–8.1)

## 2017-05-19 LAB — CBC
HEMATOCRIT: 36.3 % (ref 36.0–46.0)
Hemoglobin: 12.4 g/dL (ref 12.0–15.0)
MCH: 27.6 pg (ref 26.0–34.0)
MCHC: 34.2 g/dL (ref 30.0–36.0)
MCV: 80.7 fL (ref 78.0–100.0)
Platelets: 328 10*3/uL (ref 150–400)
RBC: 4.5 MIL/uL (ref 3.87–5.11)
RDW: 13.2 % (ref 11.5–15.5)
WBC: 12.4 10*3/uL — AB (ref 4.0–10.5)

## 2017-05-19 LAB — POC URINE PREG, ED: PREG TEST UR: NEGATIVE

## 2017-05-19 LAB — LIPASE, BLOOD: LIPASE: 12 U/L (ref 11–51)

## 2017-05-19 MED ORDER — SODIUM CHLORIDE 0.9 % IV BOLUS (SEPSIS)
1000.0000 mL | Freq: Once | INTRAVENOUS | Status: AC
  Administered 2017-05-19: 1000 mL via INTRAVENOUS

## 2017-05-19 MED ORDER — ONDANSETRON HCL 4 MG/2ML IJ SOLN
4.0000 mg | Freq: Once | INTRAMUSCULAR | Status: AC
  Administered 2017-05-19: 4 mg via INTRAVENOUS
  Filled 2017-05-19: qty 2

## 2017-05-19 MED ORDER — INSULIN ASPART 100 UNIT/ML ~~LOC~~ SOLN
5.0000 [IU] | Freq: Once | SUBCUTANEOUS | Status: AC
  Administered 2017-05-19: 5 [IU] via INTRAVENOUS
  Filled 2017-05-19: qty 1

## 2017-05-19 MED ORDER — METOCLOPRAMIDE HCL 10 MG PO TABS: 10.0000 mg | ORAL_TABLET | Freq: Four times a day (QID) | ORAL | 0 refills | Status: DC | PRN

## 2017-05-19 NOTE — ED Provider Notes (Signed)
[facility] DEPT Provider Note   CSN: [id number] Arrival date & time: 05/19/17  0128     History   Chief Complaint Chief Complaint  Patient presents with  . Abdominal Pain    HPI Elizabeth Blackburn is a 26 y.o. female.  The history is provided by the patient.  Abdominal Pain    She complains of nausea and vomiting for the last 2 days. She initially had diarrhea, but that subsided on the initial day. She denies fever, chills, sweats. She has had episodes of sharp, stabbing pain in the left mid and lower abdomen. Pain only last for a few seconds before resolving. She has not taken anything for her symptoms. She denies any sick contacts. She checked her blood glucose yesterday and it was 220. She has not checked it today. Last menses was June 4. She says she is not using any contraception.  Past Medical History:  Diagnosis Date  . Diabetes mellitus without complication ([place])   . MRSA (methicillin resistant Staphylococcus aureus)     Patient Active Problem List   Diagnosis Date Noted  . Intertrigo 08/03/2016  . Uncontrolled diabetes mellitus ([place]) 08/03/2016  . Cellulitis 07/31/2016  . Breast abscess 07/31/2016  . Diabetes mellitus ([place]) 07/31/2016    Past Surgical History:  Procedure Laterality Date  . IRRIGATION AND DEBRIDEMENT ABSCESS Right 07/31/2016   Procedure: IRRIGATION AND DEBRIDEMENT RIGHT BREAST ABSCESS;  Surgeon: Clovis Riley, MD;  Location: [facility];  Service: General;  Laterality: Right;    OB History    No data available       Home Medications    Prior to Admission medications   Medication Sig Start Date End Date Taking? Authorizing Provider  blood glucose meter kit and supplies Dispense based on patient and insurance preference. Use up to four times daily as directed. (FOR ICD-9 250.00, 250.01). 08/03/16   Johnson, Clanford L, MD  Cetirizine HCl (ZYRTEC ALLERGY) 10 MG CAPS Take 1 capsule (10 mg total) by mouth daily. 03/21/17   Ward, Kristen N, DO    CHLORHEXIDINE GLUCONATE, BULK, SOLN Apply while bathing/showering.  Cover skin and cleanse/scrub gently.  Rinse off.  Use until gone. Patient not taking: Reported on 07/31/2016 05/04/16   Montine Circle, PA-C  clotrimazole (GYNE-LOTRIMIN) 1 % vaginal cream Place 1 Applicatorful vaginally at bedtime. Patient not taking: Reported on 03/21/2017 08/03/16   Murlean Iba, MD  ibuprofen (ADVIL,MOTRIN) 200 MG tablet Take 400 mg by mouth every 6 (six) hours as needed for headache.    [provider]  insulin NPH-regular Human (NOVOLIN 70/30) (70-30) 100 UNIT/ML injection Inject 25 Units into the skin 2 (two) times daily with a meal. 08/03/16   Johnson, Clanford L, MD  Insulin Syringe-Needle U-100 (INSULIN SYRINGE .3CC/31GX5/16") 31G X 5/16" 0.3 ML MISC Use as directed 08/03/16   Wynetta Emery, Clanford L, MD  metFORMIN (GLUCOPHAGE-XR) 500 MG 24 hr tablet Take 1 tablet (500 mg total) by mouth 2 (two) times daily with a meal. 08/03/16   Johnson, Clanford L, MD  oxyCODONE (OXY IR/ROXICODONE) 5 MG immediate release tablet Take 0.5-1 tablets (2.5-5 mg total) by mouth every 6 (six) hours as needed for severe pain. Patient not taking: Reported on 07/31/2016 04/20/16   Margarita Mail, PA-C    Family History History reviewed. No pertinent family history.  Social History Social History  Substance Use Topics  . Smoking status: Never Smoker  . Smokeless tobacco: Never Used  . Alcohol use Yes     Allergies  Peanut-containing drug products and Tylenol [acetaminophen]   Review of Systems Review of Systems  Gastrointestinal: Positive for abdominal pain.  All other systems reviewed and are negative.    Physical Exam Updated Vital Signs BP (!) 171/109   Pulse 99   Temp 98.4 F (36.9 C) (Oral)   Resp 20   Ht _0  (1.626 m)   Wt 113.4 kg (250 lb)   SpO2 99%   BMI 42.91 kg/m   Physical Exam  Nursing note and vitals reviewed.  26 year old female, resting comfortably and in no acute  distress. Vital signs are significant for hypertension. Oxygen saturation is 99%, which is normal. Head is normocephalic and atraumatic. PERRLA, EOMI. Oropharynx is clear. Neck is nontender and supple without adenopathy or JVD. Back is nontender and there is no CVA tenderness. Lungs are clear without rales, wheezes, or rhonchi. Chest is nontender. Heart has regular rate and rhythm without murmur. Abdomen is soft, flat, nontender without masses or hepatosplenomegaly and peristalsis is hypoactive. Extremities have no cyanosis or edema, full range of motion is present. Skin is warm and dry without rash. Neurologic: Mental status is normal, cranial nerves are intact, there are no motor or sensory deficits.  ED Treatments / Results  Labs (all labs ordered are listed, but only abnormal results are displayed) Labs Reviewed  COMPREHENSIVE METABOLIC PANEL - Abnormal; Notable for the following:       Result Value   Chloride 100 (*)    Glucose, Bld 428 (*)    ALT 12 (*)    Total Bilirubin <0.1 (*)    All other components within normal limits  CBC - Abnormal; Notable for the following:    WBC 12.4 (*)    All other components within normal limits  URINALYSIS, ROUTINE W REFLEX MICROSCOPIC - Abnormal; Notable for the following:    Color, Urine STRAW (*)    Specific Gravity, Urine 1.035 (*)    Glucose, UA >=500 (*)    Squamous Epithelial / LPF 0-5 (*)    All other components within normal limits  LIPASE, BLOOD  POC URINE PREG, ED   Procedures Procedures (including critical care time)  Medications Ordered in ED Medications  sodium chloride 0.9 % bolus 1,000 mL (not administered)  ondansetron (ZOFRAN) injection 4 mg (not administered)  insulin aspart (novoLOG) injection 5 Units (not administered)     Initial Impression / Assessment and Plan / ED Course  I have reviewed the triage vital signs and the nursing notes.  Pertinent labs & imaging results that were available during my care of  the patient were reviewed by me and considered in my medical decision making (see chart for details).  Nausea, vomiting, diarrhea. Pregnancy test has come back negative. At this point, clinical picture seems most consistent with viral gastroenteritis. I read faxed to suggest more serious illness. WBC is mildly elevated, glucose is moderately elevated to 428. Electrolytes are normal. She is given IV fluids, ondansetron, insulin. Old records are reviewed, and she has no relevant past visits.  She feels much better after above noted treatment. She is discharged with prescription for metoclopramide.  Final Clinical Impressions(s) / ED Diagnoses   Final diagnoses:  Nausea and vomiting, intractability of vomiting not specified, unspecified vomiting type    New Prescriptions New Prescriptions   METOCLOPRAMIDE (REGLAN) 10 MG TABLET    Take 1 tablet (10 mg total) by mouth every 6 (six) hours as needed for nausea (or headache).     Roxanne Mins,  Shanon Brow, MD 05/19/17 [phone]

## 2017-05-19 NOTE — Discharge Instructions (Signed)
Take loperamide (Imodium A-D) as needed for diarrhea. ?

## 2017-05-19 NOTE — ED Triage Notes (Signed)
States for 2 days nausea vomiting and left side pain no dysuria or fever voiced.

## 2017-06-05 ENCOUNTER — Encounter (HOSPITAL_COMMUNITY): Payer: Self-pay | Admitting: Emergency Medicine

## 2017-06-05 ENCOUNTER — Emergency Department (HOSPITAL_COMMUNITY)
Admission: EM | Admit: 2017-06-05 | Discharge: 2017-06-05 | Disposition: A | Discharge status: Home / Self Care | Payer: Self-pay | Attending: Emergency Medicine | Admitting: Emergency Medicine

## 2017-06-05 DIAGNOSIS — Z202 Contact with and (suspected) exposure to infections with a predominantly sexual mode of transmission: Secondary | ICD-10-CM

## 2017-06-05 DIAGNOSIS — Z91010 Allergy to peanuts: Secondary | ICD-10-CM | POA: Insufficient documentation

## 2017-06-05 DIAGNOSIS — R739 Hyperglycemia, unspecified: Secondary | ICD-10-CM

## 2017-06-05 DIAGNOSIS — Z7984 Long term (current) use of oral hypoglycemic drugs: Secondary | ICD-10-CM | POA: Insufficient documentation

## 2017-06-05 DIAGNOSIS — E1165 Type 2 diabetes mellitus with hyperglycemia: Secondary | ICD-10-CM | POA: Insufficient documentation

## 2017-06-05 LAB — URINALYSIS, ROUTINE W REFLEX MICROSCOPIC
Bilirubin Urine: NEGATIVE
Glucose, UA: >=500 mg/dL — AB
Ketones, ur: 5 mg/dL — AB
Leukocytes, UA: NEGATIVE
Nitrite: NEGATIVE
Protein, ur: NEGATIVE mg/dL
SPECIFIC GRAVITY, URINE: 1.044 — AB (ref 1.005–1.030)
pH: 5 (ref 5.0–8.0)

## 2017-06-05 LAB — CBC
HEMATOCRIT: 38.6 % (ref 36.0–46.0)
Hemoglobin: 13.2 g/dL (ref 12.0–15.0)
MCH: 27.6 pg (ref 26.0–34.0)
MCHC: 34.2 g/dL (ref 30.0–36.0)
MCV: 80.8 fL (ref 78.0–100.0)
Platelets: 342 10*3/uL (ref 150–400)
RBC: 4.78 MIL/uL (ref 3.87–5.11)
RDW: 13 % (ref 11.5–15.5)
WBC: 12.6 10*3/uL — ABNORMAL HIGH (ref 4.0–10.5)

## 2017-06-05 LAB — CBG MONITORING, ED
GLUCOSE-CAPILLARY: 297 mg/dL — AB (ref 65–99)
Glucose-Capillary: 468 mg/dL — ABNORMAL HIGH (ref 65–99)

## 2017-06-05 LAB — BASIC METABOLIC PANEL
Anion gap: 8 (ref 5–15)
BUN: 18 mg/dL (ref 6–20)
CHLORIDE: 98 mmol/L — AB (ref 101–111)
CO2: 28 mmol/L (ref 22–32)
Calcium: 9.3 mg/dL (ref 8.9–10.3)
Creatinine, Ser: 0.58 mg/dL (ref 0.44–1.00)
GFR calc Af Amer: >60 mL/min (ref >60)
GFR calc non Af Amer: >60 mL/min (ref >60)
GLUCOSE: 421 mg/dL — AB (ref 65–99)
POTASSIUM: 4.2 mmol/L (ref 3.5–5.1)
Sodium: 134 mmol/L — ABNORMAL LOW (ref 135–145)

## 2017-06-05 LAB — PREGNANCY, URINE: Preg Test, Ur: NEGATIVE

## 2017-06-05 MED ORDER — AZITHROMYCIN 250 MG PO TABS
1000.0000 mg | ORAL_TABLET | Freq: Once | ORAL | Status: AC
  Administered 2017-06-05: 1000 mg via ORAL
  Filled 2017-06-05: qty 4

## 2017-06-05 MED ORDER — INSULIN NPH ISOPHANE & REGULAR (70-30) 100 UNIT/ML ~~LOC~~ SUSP: 25.0000 [IU] | Freq: Two times a day (BID) | SUBCUTANEOUS | 0 refills | Status: DC

## 2017-06-05 MED ORDER — METFORMIN HCL ER 500 MG PO TB24: 500.0000 mg | ORAL_TABLET | Freq: Two times a day (BID) | ORAL | 0 refills | Status: DC

## 2017-06-05 MED ORDER — STERILE WATER FOR INJECTION IJ SOLN
INTRAMUSCULAR | Status: DC
Start: 2017-06-05 — End: 2017-06-05
  Filled 2017-06-05: qty 10

## 2017-06-05 MED ORDER — INSULIN ASPART 100 UNIT/ML ~~LOC~~ SOLN
12.0000 [IU] | Freq: Once | SUBCUTANEOUS | Status: AC
  Administered 2017-06-05: 12 [IU] via SUBCUTANEOUS
  Filled 2017-06-05: qty 1

## 2017-06-05 MED ORDER — SODIUM CHLORIDE 0.9 % IV BOLUS (SEPSIS)
1000.0000 mL | Freq: Once | INTRAVENOUS | Status: AC
  Administered 2017-06-05: 1000 mL via INTRAVENOUS

## 2017-06-05 MED ORDER — CEFTRIAXONE SODIUM 250 MG IJ SOLR
250.0000 mg | Freq: Once | INTRAMUSCULAR | Status: AC
  Administered 2017-06-05: 250 mg via INTRAMUSCULAR
  Filled 2017-06-05: qty 250

## 2017-06-05 NOTE — ED Notes (Addendum)
Pt states she urinated before coming back to her room. Pt aware of need for urine sample.
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
## 2021-02-17 ENCOUNTER — Encounter (HOSPITAL_COMMUNITY): Payer: Self-pay

## 2021-02-17 ENCOUNTER — Other Ambulatory Visit: Payer: Self-pay

## 2021-02-17 ENCOUNTER — Emergency Department (HOSPITAL_COMMUNITY)
Admission: EM | Admit: 2021-02-17 | Discharge: 2021-02-17 | Disposition: A | Discharge status: Home / Self Care | Payer: BC Managed Care – PPO | Attending: Emergency Medicine | Admitting: Emergency Medicine

## 2021-02-17 ENCOUNTER — Emergency Department (HOSPITAL_COMMUNITY): Payer: BC Managed Care – PPO

## 2021-02-17 DIAGNOSIS — W231XXA Caught, crushed, jammed, or pinched between stationary objects, initial encounter: Secondary | ICD-10-CM | POA: Insufficient documentation

## 2021-02-17 DIAGNOSIS — Z91010 Allergy to peanuts: Secondary | ICD-10-CM | POA: Insufficient documentation

## 2021-02-17 DIAGNOSIS — Z794 Long term (current) use of insulin: Secondary | ICD-10-CM | POA: Insufficient documentation

## 2021-02-17 DIAGNOSIS — E119 Type 2 diabetes mellitus without complications: Secondary | ICD-10-CM | POA: Diagnosis not present

## 2021-02-17 DIAGNOSIS — Z7984 Long term (current) use of oral hypoglycemic drugs: Secondary | ICD-10-CM | POA: Insufficient documentation

## 2021-02-17 DIAGNOSIS — Y990 Civilian activity done for income or pay: Secondary | ICD-10-CM | POA: Insufficient documentation

## 2021-02-17 DIAGNOSIS — S60922A Unspecified superficial injury of left hand, initial encounter: Secondary | ICD-10-CM | POA: Diagnosis present

## 2021-02-17 DIAGNOSIS — S60222A Contusion of left hand, initial encounter: Secondary | ICD-10-CM | POA: Diagnosis not present

## 2021-02-17 NOTE — Discharge Instructions (Addendum)
Recheck with your workers comp provider if not improving in 2-3 days. Buddy tape fingers as needed for comfort. Take Motrin and Tylenol as needed as directed. Apply ice for 20 minutes at a time as needed for pain.

## 2021-02-17 NOTE — ED Triage Notes (Signed)
Pt arrived via POV, hit left hand between metal pole and carriage at work last night. Swelling and pain to left hand. No other injury.

## 2021-02-17 NOTE — ED Provider Notes (Signed)
[facility] DEPT Provider Note   CSN: [id number] Arrival date & time: 02/17/21  5993     History Chief Complaint  Patient presents with  . Hand Injury    Elizabeth Blackburn is a 30 y.o. female.  30 year old female with complaint of left hand injury, hand got stuck between a cart and a pole yesterday at work, notified her employer and was sent to the ER for evaluation. Patient is right hand dominant. No other injuries.         Past Medical History:  Diagnosis Date  . Diabetes mellitus without complication ([place])   . MRSA (methicillin resistant Staphylococcus aureus)     Patient Active Problem List   Diagnosis Date Noted  . Intertrigo 08/03/2016  . Uncontrolled diabetes mellitus ([place]) 08/03/2016  . Cellulitis 07/31/2016  . Breast abscess 07/31/2016  . Diabetes mellitus ([place]) 07/31/2016    Past Surgical History:  Procedure Laterality Date  . IRRIGATION AND DEBRIDEMENT ABSCESS Right 07/31/2016   Procedure: IRRIGATION AND DEBRIDEMENT RIGHT BREAST ABSCESS;  Surgeon: Clovis Riley, MD;  Location: [facility];  Service: General;  Laterality: Right;     OB History   No obstetric history on file.     History reviewed. No pertinent family history.  Social History   Tobacco Use  . Smoking status: Never Smoker  . Smokeless tobacco: Never Used  Substance Use Topics  . Alcohol use: Yes  . Drug use: No    Home Medications Prior to Admission medications   Medication Sig Start Date End Date Taking? Authorizing Provider  blood glucose meter kit and supplies Dispense based on patient and insurance preference. Use up to four times daily as directed. (FOR ICD-9 250.00, 250.01). 08/03/16   Johnson, Clanford L, MD  Cetirizine HCl (ZYRTEC ALLERGY) 10 MG CAPS Take 1 capsule (10 mg total) by mouth daily. Patient not taking: Reported on 06/05/2017 03/21/17   Ward, Delice Bison, DO  CHLORHEXIDINE GLUCONATE, BULK, SOLN Apply while bathing/showering.  Cover skin  and cleanse/scrub gently.  Rinse off.  Use until gone. Patient not taking: Reported on 07/31/2016 05/04/16   Montine Circle, PA-C  clotrimazole (GYNE-LOTRIMIN) 1 % vaginal cream Place 1 Applicatorful vaginally at bedtime. Patient not taking: Reported on 03/21/2017 08/03/16   Murlean Iba, MD  ibuprofen (ADVIL,MOTRIN) 200 MG tablet Take 400 mg by mouth every 6 (six) hours as needed for headache.    [provider]  insulin NPH-regular Human (NOVOLIN 70/30) (70-30) 100 UNIT/ML injection Inject 25 Units into the skin 2 (two) times daily with a meal. 06/05/17 07/05/17  Cardama, Grayce Sessions, MD  Insulin Syringe-Needle U-100 (INSULIN SYRINGE .3CC/31GX5/16") 31G X 5/16" 0.3 ML MISC Use as directed 08/03/16   Murlean Iba, MD  metFORMIN (GLUCOPHAGE-XR) 500 MG 24 hr tablet Take 1 tablet (500 mg total) by mouth 2 (two) times daily with a meal. 06/05/17 07/05/17  Cardama, Grayce Sessions, MD  metFORMIN (GLUMETZA) 1000 MG (MOD) 24 hr tablet Take 1,000 mg by mouth 2 (two) times daily with a meal.    [provider]  metoCLOPramide (REGLAN) 10 MG tablet Take 1 tablet (10 mg total) by mouth every 6 (six) hours as needed for nausea (or headache). Patient not taking: Reported on 5/70/1779 3/90/30   Delora Fuel, MD  oxyCODONE (OXY IR/ROXICODONE) 5 MG immediate release tablet Take 0.5-1 tablets (2.5-5 mg total) by mouth every 6 (six) hours as needed for severe pain. Patient not taking: Reported on 07/31/2016 04/20/16  Margarita Mail, PA-C    Allergies    Peanut-containing drug products and Tylenol [acetaminophen]  Review of Systems   Review of Systems  Constitutional: Negative for fever.  Musculoskeletal: Positive for arthralgias and joint swelling.  Skin: Negative for rash and wound.  Allergic/Immunologic: Negative for immunocompromised state.  Neurological: Negative for weakness and numbness.  Hematological: Does not bruise/bleed easily.    Physical Exam Updated Vital  Signs BP (!) 149/98 (BP Location: Left Arm)   Pulse (!) 107   Temp 98.2 F (36.8 C) (Oral)   Resp 18   LMP 02/05/2021 (Exact Date)   Physical Exam Vitals and nursing note reviewed.  Constitutional:      General: She is not in acute distress.    Appearance: She is well-developed. She is not diaphoretic.  HENT:     Head: Normocephalic and atraumatic.  Cardiovascular:     Pulses: Normal pulses.  Pulmonary:     Effort: Pulmonary effort is normal.  Musculoskeletal:        General: Swelling, tenderness and signs of injury present. No deformity.     Right hand: Normal.     Left hand: Bony tenderness present. Normal range of motion. Normal strength. Normal sensation.     Comments: Bruising, swelling, tenderness over left 2nd-3rd MCPs, skin intact, brisk cap refill present, sensation intact, normal ROM.   Skin:    General: Skin is warm and dry.     Capillary Refill: Capillary refill takes less than 2 seconds.     Findings: No erythema or rash.  Neurological:     Mental Status: She is alert and oriented to person, place, and time.     Sensory: No sensory deficit.     Motor: No weakness.  Psychiatric:        Behavior: Behavior normal.     ED Results / Procedures / Treatments   Labs (all labs ordered are listed, but only abnormal results are displayed) Labs Reviewed - No data to display  EKG None  Radiology DG Hand Complete Left  Result Date: 02/17/2021 CLINICAL DATA:  Hand injury with pain EXAM: LEFT HAND - COMPLETE 3+ VIEW COMPARISON:  None. FINDINGS: There is no evidence of fracture or dislocation. There is no evidence of arthropathy or other focal bone abnormality. Soft tissues are unremarkable. IMPRESSION: Negative. Electronically Signed   By: Macy Mis M.D.   On: 02/17/2021 11:15    Procedures Procedures   Medications Ordered in ED Medications - No data to display  ED Course  I have reviewed the triage vital signs and the nursing notes.  Pertinent labs &  imaging results that were available during my care of the patient were reviewed by me and considered in my medical decision making (see chart for details).  Clinical Course as of 02/17/21 1132  Fri Feb 17, 6057  5057 30 year old female with left hand injury at work as above. Exam with bruising/swelling/tenderness at 2-3rd MCP left hand. XR normal. Plan is to buddy tape the fingers for comfort, RICE, motrin/tylenol. Follow up with [facility] provider if not improving in 2-3 days.  [LM]    Clinical Course User Index [LM] Roque Lias   MDM Rules/Calculators/A&P                         Final Clinical Impression(s) / ED Diagnoses Final diagnoses:  Contusion of left hand, initial encounter    Rx / DC Orders ED Discharge Orders  None       Tacy Learn, PA-C 02/17/21 1132    Lucrezia Starch, MD 02/18/21 [phone]

## 2021-03-16 ENCOUNTER — Encounter: Payer: Self-pay | Admitting: Emergency Medicine

## 2021-03-16 ENCOUNTER — Emergency Department
Admission: EM | Admit: 2021-03-16 | Discharge: 2021-03-16 | Disposition: A | Discharge status: Home / Self Care | Payer: BC Managed Care – PPO | Attending: Emergency Medicine | Admitting: Emergency Medicine

## 2021-03-16 ENCOUNTER — Other Ambulatory Visit: Payer: Self-pay

## 2021-03-16 DIAGNOSIS — Z794 Long term (current) use of insulin: Secondary | ICD-10-CM | POA: Insufficient documentation

## 2021-03-16 DIAGNOSIS — R42 Dizziness and giddiness: Secondary | ICD-10-CM | POA: Diagnosis present

## 2021-03-16 DIAGNOSIS — Z5321 Procedure and treatment not carried out due to patient leaving prior to being seen by health care provider: Secondary | ICD-10-CM | POA: Insufficient documentation

## 2021-03-16 DIAGNOSIS — E1165 Type 2 diabetes mellitus with hyperglycemia: Secondary | ICD-10-CM | POA: Diagnosis not present

## 2021-03-16 DIAGNOSIS — Z7984 Long term (current) use of oral hypoglycemic drugs: Secondary | ICD-10-CM | POA: Insufficient documentation

## 2021-03-16 LAB — URINALYSIS, COMPLETE (UACMP) WITH MICROSCOPIC
Bilirubin Urine: NEGATIVE
Glucose, UA: >=500 mg/dL — AB
Ketones, ur: 5 mg/dL — AB
Nitrite: POSITIVE — AB
Protein, ur: 100 mg/dL — AB
Specific Gravity, Urine: 1.03 (ref 1.005–1.030)
pH: 6 (ref 5.0–8.0)

## 2021-03-16 LAB — CBC
HCT: 35 % — ABNORMAL LOW (ref 36.0–46.0)
Hemoglobin: 11.6 g/dL — ABNORMAL LOW (ref 12.0–15.0)
MCH: 26.8 pg (ref 26.0–34.0)
MCHC: 33.1 g/dL (ref 30.0–36.0)
MCV: 80.8 fL (ref 80.0–100.0)
Platelets: 328 10*3/uL (ref 150–400)
RBC: 4.33 MIL/uL (ref 3.87–5.11)
RDW: 13.6 % (ref 11.5–15.5)
WBC: 12.9 10*3/uL — ABNORMAL HIGH (ref 4.0–10.5)
nRBC: 0 % (ref 0.0–0.2)

## 2021-03-16 LAB — BASIC METABOLIC PANEL
Anion gap: 10 (ref 5–15)
BUN: 15 mg/dL (ref 6–20)
CO2: 27 mmol/L (ref 22–32)
Calcium: 9.2 mg/dL (ref 8.9–10.3)
Chloride: 98 mmol/L (ref 98–111)
Creatinine, Ser: 0.73 mg/dL (ref 0.44–1.00)
GFR, Estimated: >60 mL/min (ref >60)
Glucose, Bld: 413 mg/dL — ABNORMAL HIGH (ref 70–99)
Potassium: 3.9 mmol/L (ref 3.5–5.1)
Sodium: 135 mmol/L (ref 135–145)

## 2021-03-16 LAB — CBG MONITORING, ED: Glucose-Capillary: 385 mg/dL — ABNORMAL HIGH (ref 70–99)

## 2021-03-16 LAB — POC URINE PREG, ED: Preg Test, Ur: NEGATIVE

## 2021-03-16 NOTE — ED Notes (Signed)
Pt left facility without telling staff.  Pt called to be roomed, but patient unable to be found within facility.

## 2021-03-16 NOTE — ED Triage Notes (Signed)
Pt arrived via [facility] from home with reports of dizziness since 1230am. Pt states her blood sugar was 127 prior to work.  With EMS CBG was 530.   Pt takes insulin and metformin. Pt reports she has missed 4 out 7 days of her diabetes medications.

## 2021-03-21 ENCOUNTER — Telehealth: Payer: Self-pay | Admitting: Emergency Medicine

## 2021-03-21 NOTE — Telephone Encounter (Signed)
Called patient due to left emergency department before provider exam to inquire about condition and follow up plans. She says she has appt t his week, but she is doing okay now.

## 2021-05-27 ENCOUNTER — Emergency Department
Admission: EM | Admit: 2021-05-27 | Discharge: 2021-05-27 | Disposition: A | Discharge status: Home / Self Care | Payer: BC Managed Care – PPO | Attending: Emergency Medicine | Admitting: Emergency Medicine

## 2021-05-27 ENCOUNTER — Emergency Department: Payer: BC Managed Care – PPO

## 2021-05-27 ENCOUNTER — Encounter: Payer: Self-pay | Admitting: Emergency Medicine

## 2021-05-27 ENCOUNTER — Other Ambulatory Visit: Payer: Self-pay

## 2021-05-27 DIAGNOSIS — Z7984 Long term (current) use of oral hypoglycemic drugs: Secondary | ICD-10-CM | POA: Diagnosis not present

## 2021-05-27 DIAGNOSIS — Z91010 Allergy to peanuts: Secondary | ICD-10-CM | POA: Diagnosis not present

## 2021-05-27 DIAGNOSIS — Z794 Long term (current) use of insulin: Secondary | ICD-10-CM | POA: Diagnosis not present

## 2021-05-27 DIAGNOSIS — E119 Type 2 diabetes mellitus without complications: Secondary | ICD-10-CM | POA: Diagnosis not present

## 2021-05-27 DIAGNOSIS — M25531 Pain in right wrist: Secondary | ICD-10-CM | POA: Diagnosis present

## 2021-05-27 DIAGNOSIS — G5601 Carpal tunnel syndrome, right upper limb: Secondary | ICD-10-CM | POA: Diagnosis not present

## 2021-05-27 MED ORDER — KETOROLAC TROMETHAMINE 60 MG/2ML IM SOLN
30.0000 mg | Freq: Once | INTRAMUSCULAR | Status: AC
  Administered 2021-05-27: 30 mg via INTRAMUSCULAR
  Filled 2021-05-27: qty 2

## 2021-05-27 MED ORDER — OXYCODONE HCL 5 MG PO TABS
5.0000 mg | ORAL_TABLET | Freq: Once | ORAL | Status: AC
Start: 2021-05-27 — End: 2021-05-27
  Administered 2021-05-27: 5 mg via ORAL
  Filled 2021-05-27: qty 1

## 2021-05-27 MED ORDER — OXYCODONE HCL 5 MG PO TABS: 5.0000 mg | ORAL_TABLET | Freq: Four times a day (QID) | ORAL | 0 refills | Status: DC | PRN

## 2021-05-27 MED ORDER — IBUPROFEN 800 MG PO TABS: 800.0000 mg | ORAL_TABLET | Freq: Three times a day (TID) | ORAL | 0 refills | Status: DC | PRN

## 2021-05-27 NOTE — ED Triage Notes (Signed)
Pt to ED from home c/o right wrist pain x1 week becoming worse last 2 days.  Denies know injury, does repetative motions at work, states painful to bend wrist or lift objects even phone.  States noticed swelling yesterday.  Pt points to medial anterior wrist for pain.  Skin cool and dry, strong radial pulses.

## 2021-05-27 NOTE — ED Provider Notes (Signed)
[facility] Emergency Department Provider Note   ____________________________________________   Event Date/Time   First MD Initiated Contact with Patient 05/27/21 [phone]     (approximate)  I have reviewed the triage vital signs and the nursing notes.   HISTORY  Chief Complaint Wrist Pain    HPI Elizabeth Blackburn is a 30 y.o. female who presents to the ED from home with a chief complaint of nontraumatic right wrist pain.  Patient has noticed pain to her right, dominant, inner wrist for the past week, worse over the past 2 days.  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  Noticed swelling beginning yesterday.  Denies extremity weakness, numbness or tingling.  Voices no other complaints or injuries.     Past Medical History:  Diagnosis Date  . Diabetes mellitus without complication ([place])   . MRSA (methicillin resistant Staphylococcus aureus)     Patient Active Problem List   Diagnosis Date Noted  . Intertrigo 08/03/2016  . Uncontrolled diabetes mellitus ([place]) 08/03/2016  . Cellulitis 07/31/2016  . Breast abscess 07/31/2016  . Diabetes mellitus ([place]) 07/31/2016    Past Surgical History:  Procedure Laterality Date  . IRRIGATION AND DEBRIDEMENT ABSCESS Right 07/31/2016   Procedure: IRRIGATION AND DEBRIDEMENT RIGHT BREAST ABSCESS;  Surgeon: Clovis Riley, MD;  Location: [facility] ORS;  Service: General;  Laterality: Right;    Prior to Admission medications   Medication Sig Start Date End Date Taking? Authorizing Provider  ibuprofen (ADVIL) 800 MG tablet Take 1 tablet (800 mg total) by mouth every 8 (eight) hours as needed for moderate pain. 05/27/21  Yes Paulette Blanch, MD  oxyCODONE (ROXICODONE) 5 MG immediate release tablet Take 1 tablet (5 mg total) by mouth every 6 (six) hours as needed for severe pain. 05/27/21  Yes Paulette Blanch, MD  blood glucose meter kit and supplies Dispense based on patient and insurance preference. Use  up to four times daily as directed. (FOR ICD-9 250.00, 250.01). 08/03/16   Johnson, Clanford L, MD  Cetirizine HCl (ZYRTEC ALLERGY) 10 MG CAPS Take 1 capsule (10 mg total) by mouth daily. Patient not taking: Reported on 06/05/2017 03/21/17   Ward, Delice Bison, DO  CHLORHEXIDINE GLUCONATE, BULK, SOLN Apply while bathing/showering.  Cover skin and cleanse/scrub gently.  Rinse off.  Use until gone. Patient not taking: Reported on 07/31/2016 05/04/16   Montine Circle, PA-C  clotrimazole (GYNE-LOTRIMIN) 1 % vaginal cream Place 1 Applicatorful vaginally at bedtime. Patient not taking: Reported on 03/21/2017 08/03/16   Murlean Iba, MD  insulin NPH-regular Human (NOVOLIN 70/30) (70-30) 100 UNIT/ML injection Inject 25 Units into the skin 2 (two) times daily with a meal. 06/05/17 07/05/17  Cardama, Grayce Sessions, MD  Insulin Syringe-Needle U-100 (INSULIN SYRINGE .3CC/31GX5/16") 31G X 5/16" 0.3 ML MISC Use as directed 08/03/16   Murlean Iba, MD  metFORMIN (GLUCOPHAGE-XR) 500 MG 24 hr tablet Take 1 tablet (500 mg total) by mouth 2 (two) times daily with a meal. 06/05/17 07/05/17  Cardama, Grayce Sessions, MD  metFORMIN (GLUMETZA) 1000 MG (MOD) 24 hr tablet Take 1,000 mg by mouth 2 (two) times daily with a meal.    [provider]  metoCLOPramide (REGLAN) 10 MG tablet Take 1 tablet (10 mg total) by mouth every 6 (six) hours as needed for nausea (or headache). Patient not taking: Reported on 5/42/7062 3/76/28   Delora Fuel, MD    Allergies Peanut-containing drug products and Tylenol [acetaminophen]  History reviewed. No pertinent  family history.  Social History Social History   Tobacco Use  . Smoking status: Never  . Smokeless tobacco: Never  Substance Use Topics  . Alcohol use: Yes  . Drug use: No    Review of Systems  Constitutional: No fever/chills Eyes: No visual changes. ENT: No sore throat. Cardiovascular: Denies chest pain. Respiratory: Denies shortness of  breath. Gastrointestinal: No abdominal pain.  No nausea, no vomiting.  No diarrhea.  No constipation. Genitourinary: Negative for dysuria. Musculoskeletal: Positive for right wrist pain.  Negative for back pain. Skin: Negative for rash. Neurological: Negative for headaches, focal weakness or numbness.   ____________________________________________   PHYSICAL EXAM:  VITAL SIGNS: ED Triage Vitals  Enc Vitals Group     BP 05/27/21 0232 (!) 150/96     Pulse Rate 05/27/21 0232 (!) 101     Resp 05/27/21 0232 18     Temp 05/27/21 0232 98.5 F (36.9 C)     Temp Source 05/27/21 0232 Oral     SpO2 05/27/21 0232 98 %     Weight 05/27/21 0232 227 lb (103 kg)     Height 05/27/21 0232 5' 4"  (1.626 m)     Head Circumference --      Peak Flow --      Pain Score 05/27/21 0237 7     Pain Loc --      Pain Edu? --      Excl. in [place]? --     Constitutional: Alert and oriented. Well appearing and in mild acute distress. Eyes: Conjunctivae are normal. PERRL. EOMI. Head: Atraumatic. Nose: No congestion/rhinnorhea. Mouth/Throat: Mucous membranes are moist.   Neck: No stridor.   Cardiovascular: Normal rate, regular rhythm. Grossly normal heart sounds.  Good peripheral circulation. Respiratory: Normal respiratory effort.  No retractions. Lungs CTAB. Gastrointestinal: Soft and nontender. No distention. No abdominal bruits. No CVA tenderness. Musculoskeletal:  RUE: Inner wrist mildly swollen.  Tender to palpation.  Limited range of motion secondary to pain. + Tinel's.  2+ radial pulse.  Brisk, less than 5-second capillary refill. No lower extremity tenderness nor edema.  No joint effusions. Neurologic:  Normal speech and language. No gross focal neurologic deficits are appreciated. No gait instability. Skin:  Skin is warm, dry and intact. No rash noted. Psychiatric: Mood and affect are normal. Speech and behavior are normal.  ____________________________________________   LABS (all labs ordered  are listed, but only abnormal results are displayed)  Labs Reviewed - No data to display ____________________________________________  EKG  None ____________________________________________  RADIOLOGY I, Pristine Gladhill J, personally viewed and evaluated these images (plain radiographs) as part of my medical decision making, as well as reviewing the written report by the radiologist.  ED MD interpretation: Unremarkable x-ray  Official radiology report(s): DG Wrist Complete Right  Result Date: 05/27/2021 CLINICAL DATA:  Right wrist pain for 1 week EXAM: RIGHT WRIST - COMPLETE 3+ VIEW COMPARISON:  None. FINDINGS: There is no evidence of fracture or dislocation. There is no evidence of arthropathy or other focal bone abnormality. Soft tissues are unremarkable. IMPRESSION: No acute abnormality noted. Electronically Signed   By: Inez Catalina M.D.   On: 05/27/2021 03:27    ____________________________________________   PROCEDURES  Procedure(s) performed (including Critical Care):  Procedures   ____________________________________________   INITIAL IMPRESSION / ASSESSMENT AND PLAN / ED COURSE  As part of my medical decision making, I reviewed the following data within the [facility] notes reviewed and incorporated, Old chart reviewed, Radiograph reviewed,  Notes from prior ED visits, and [place] Controlled Substance Database     30 year old female presenting with nontraumatic right wrist pain and swelling.  Will place in wrist splint, NSAIDs, analgesia and follow-up with orthopedics.  Strict return precautions given.  Patient verbalizes understanding agrees with plan of care.      ____________________________________________   FINAL CLINICAL IMPRESSION(S) / ED DIAGNOSES  Final diagnoses:  Acute pain of right wrist  Carpal tunnel syndrome of right wrist     ED Discharge Orders          Ordered    ibuprofen (ADVIL) 800 MG tablet  Every 8 hours PRN         05/27/21 0504    oxyCODONE (ROXICODONE) 5 MG immediate release tablet  Every 6 hours PRN        05/27/21 0504             Note:  This document was prepared using Dragon voice recognition software and may include unintentional dictation errors.    Paulette Blanch, MD 05/27/21 [phone]

## 2021-05-27 NOTE — Discharge Instructions (Addendum)
1.  You may take pain medicines as needed (Motrin/Roxicodone #15). 2.  Wear wrist brace as needed for comfort. 3.  Return to the ER for worsening symptoms, increased swelling, numbness/tingling or other concerns.

## 2021-05-30 ENCOUNTER — Encounter: Payer: Self-pay | Admitting: Emergency Medicine

## 2021-05-30 ENCOUNTER — Emergency Department
Admission: EM | Admit: 2021-05-30 | Discharge: 2021-05-30 | Disposition: A | Discharge status: Home / Self Care | Payer: BC Managed Care – PPO | Attending: Emergency Medicine | Admitting: Emergency Medicine

## 2021-05-30 ENCOUNTER — Other Ambulatory Visit: Payer: Self-pay

## 2021-05-30 DIAGNOSIS — S63501D Unspecified sprain of right wrist, subsequent encounter: Secondary | ICD-10-CM | POA: Insufficient documentation

## 2021-05-30 DIAGNOSIS — Z7984 Long term (current) use of oral hypoglycemic drugs: Secondary | ICD-10-CM | POA: Diagnosis not present

## 2021-05-30 DIAGNOSIS — E119 Type 2 diabetes mellitus without complications: Secondary | ICD-10-CM | POA: Diagnosis not present

## 2021-05-30 DIAGNOSIS — S6991XD Unspecified injury of right wrist, hand and finger(s), subsequent encounter: Secondary | ICD-10-CM | POA: Diagnosis present

## 2021-05-30 DIAGNOSIS — Z794 Long term (current) use of insulin: Secondary | ICD-10-CM | POA: Insufficient documentation

## 2021-05-30 DIAGNOSIS — X58XXXD Exposure to other specified factors, subsequent encounter: Secondary | ICD-10-CM | POA: Diagnosis not present

## 2021-05-30 MED ORDER — CYCLOBENZAPRINE HCL 5 MG PO TABS: 5.0000 mg | ORAL_TABLET | Freq: Three times a day (TID) | ORAL | 0 refills | Status: DC | PRN

## 2021-05-30 NOTE — Discharge Instructions (Addendum)
Take the prescription muscle relaxant along with the prescription ibuprofen from the last visit. Follow-up with Dr.Poggi for continued symptoms. Return if needed.

## 2021-05-30 NOTE — ED Triage Notes (Signed)
Seen through ED 8/6, diagnosed with carpal tunnel.  Arrives today c/o wrist swelling.  Wrist brace on.

## 2021-05-30 NOTE — ED Provider Notes (Signed)
[facility] Emergency Department Provider Note ____________________________________________  Time seen: 49  I have reviewed the triage vital signs and the nursing notes.  HISTORY  Chief Complaint  Joint Swelling   HPI Elizabeth Blackburn is a 30 y.o. female presents herself to the ED for ongoing pain and swelling to the volar wrist.  Patient was recently diagnosed with carpal tunnel syndrome and placed in a Velcro wrist cock-up splint.  She presents to the ED reporting some volar wrist pain and swelling.  She has not been evaluated by Ortho, is awaiting a phone call to schedule an appointment.  She denies any reinjury.  She also has not returned to work as she scheduled to return tomorrow.  Past Medical History:  Diagnosis Date   Diabetes mellitus without complication ([place])    MRSA (methicillin resistant Staphylococcus aureus)     Patient Active Problem List   Diagnosis Date Noted   Intertrigo 08/03/2016   Uncontrolled diabetes mellitus ([place]) 08/03/2016   Cellulitis 07/31/2016   Breast abscess 07/31/2016   Diabetes mellitus ([place]) 07/31/2016    Past Surgical History:  Procedure Laterality Date   IRRIGATION AND DEBRIDEMENT ABSCESS Right 07/31/2016   Procedure: IRRIGATION AND DEBRIDEMENT RIGHT BREAST ABSCESS;  Surgeon: Clovis Riley, MD;  Location: [facility]S;  Service: General;  Laterality: Right;    Prior to Admission medications   Medication Sig Start Date End Date Taking? Authorizing Provider  cyclobenzaprine (FLEXERIL) 5 MG tablet Take 1 tablet (5 mg total) by mouth 3 (three) times daily as needed. 05/30/21  Yes Lilianna Case, Dannielle Karvonen, PA-C  blood glucose meter kit and supplies Dispense based on patient and insurance preference. Use up to four times daily as directed. (FOR ICD-9 250.00, 250.01). 08/03/16   Johnson, Clanford L, MD  ibuprofen (ADVIL) 800 MG tablet Take 1 tablet (800 mg total) by mouth every 8 (eight) hours as needed for moderate pain. 05/27/21    Paulette Blanch, MD  insulin NPH-regular Human (NOVOLIN 70/30) (70-30) 100 UNIT/ML injection Inject 25 Units into the skin 2 (two) times daily with a meal. 06/05/17 07/05/17  Cardama, Grayce Sessions, MD  Insulin Syringe-Needle U-100 (INSULIN SYRINGE .3CC/31GX5/16") 31G X 5/16" 0.3 ML MISC Use as directed 08/03/16   Murlean Iba, MD  metFORMIN (GLUCOPHAGE-XR) 500 MG 24 hr tablet Take 1 tablet (500 mg total) by mouth 2 (two) times daily with a meal. 06/05/17 07/05/17  Cardama, Grayce Sessions, MD  metFORMIN (GLUMETZA) 1000 MG (MOD) 24 hr tablet Take 1,000 mg by mouth 2 (two) times daily with a meal.    [provider]  oxyCODONE (ROXICODONE) 5 MG immediate release tablet Take 1 tablet (5 mg total) by mouth every 6 (six) hours as needed for severe pain. 05/27/21   Paulette Blanch, MD    Allergies Peanut-containing drug products and Tylenol [acetaminophen]  History reviewed. No pertinent family history.  Social History Social History   Tobacco Use   Smoking status: Never   Smokeless tobacco: Never  Substance Use Topics   Alcohol use: Yes   Drug use: No    Review of Systems  Constitutional: Negative for fever. Cardiovascular: Negative for chest pain. Respiratory: Negative for shortness of breath. Gastrointestinal: Negative for abdominal pain, vomiting and diarrhea. Genitourinary: Negative for dysuria. Musculoskeletal: Negative for back pain.  Right wrist pain as above. Skin: Negative for rash. Neurological: Negative for headaches, focal weakness or numbness. ____________________________________________  PHYSICAL EXAM:  VITAL SIGNS: ED Triage Vitals  Enc Vitals Group  BP 05/30/21 1026 (!) 148/78     Pulse Rate 05/30/21 1026 88     Resp 05/30/21 1026 18     Temp 05/30/21 1026 98 F (36.7 C)     Temp Source 05/30/21 1026 Oral     SpO2 05/30/21 1026 98 %     Weight 05/30/21 0958 226 lb 13.7 oz (102.9 kg)     Height 05/30/21 0958 5' 4"  (1.626 m)     Head Circumference --       Peak Flow --      Pain Score 05/30/21 0958 4     Pain Loc --      Pain Edu? --      Excl. in [place]? --     Constitutional: Alert and oriented. Well appearing and in no distress. Head: Normocephalic and atraumatic. Eyes: Conjunctivae are normal. Normal extraocular movements Cardiovascular: Normal rate, regular rhythm. Normal distal pulses. Respiratory: Normal respiratory effort. . Musculoskeletal: Right hand and wrist without obvious deformity or dislocation.  Patient is tender to palpation to the Guyon's canal at the base of the ulnar wrist.  Normal composite fist on exam.  Nontender with normal range of motion in all extremities.  Neurologic: Cranial nerves II to XII grossly intact.  Normal UE/LE DTRs bilaterally.  Normal intrinsic and opposition testing noted.  Normal gait without ataxia. Normal speech and language. No gross focal neurologic deficits are appreciated. Skin:  Skin is warm, dry and intact. No rash noted. Psychiatric: Mood and affect are normal. Patient exhibits appropriate insight and judgment. ____________________________________________    {LABS (pertinent positives/negatives)  ____________________________________________  {EKG  ____________________________________________   RADIOLOGY Official radiology report(s): No results found. ____________________________________________  PROCEDURES   Procedures ____________________________________________   INITIAL IMPRESSION / ASSESSMENT AND PLAN / ED COURSE  As part of my medical decision making, I reviewed the following data within the electronic MEDICAL RECORD NUMBER Notes from prior ED visits      Patient with ED re-evaluation of continued right wrist pain with a recent diagnosis of carpal tunnel syndrome.  Patient exam is overall benign reassuring at this time.  She is also without any signs of acute neuromuscular deficit or acute disability at this time.  She will be again referred to Ortho for ongoing management  of her symptoms.  She is given instruction on stretching exercises and a small prescription for Flexeril is provided for benefit.  She will follow-up with Ortho as discussed.  Work note is provided as requested.   Adley Castello was evaluated in Emergency Department on 05/30/2021 for the symptoms described in the history of present illness. She was evaluated in the context of the global COVID-19 pandemic, which necessitated consideration that the patient might be at risk for infection with the SARS-CoV-2 virus that causes COVID-19. Institutional protocols and algorithms that pertain to the evaluation of patients at risk for COVID-19 are in a state of rapid change based on information released by regulatory bodies including the CDC and federal and state organizations. These policies and algorithms were followed during the patient's care in the ED. ____________________________________________  FINAL CLINICAL IMPRESSION(S) / ED DIAGNOSES  Final diagnoses:  Wrist sprain, right, subsequent encounter      Melvenia Needles, PA-C 05/30/21 1724    Duffy Bruce, MD 05/31/21 1916

## 2021-06-10 ENCOUNTER — Emergency Department: Payer: BC Managed Care – PPO

## 2021-06-10 ENCOUNTER — Emergency Department
Admission: EM | Admit: 2021-06-10 | Discharge: 2021-06-10 | Disposition: A | Discharge status: Home / Self Care | Payer: BC Managed Care – PPO | Attending: Emergency Medicine | Admitting: Emergency Medicine

## 2021-06-10 ENCOUNTER — Other Ambulatory Visit: Payer: Self-pay

## 2021-06-10 ENCOUNTER — Encounter: Payer: Self-pay | Admitting: Emergency Medicine

## 2021-06-10 DIAGNOSIS — Z20822 Contact with and (suspected) exposure to covid-19: Secondary | ICD-10-CM | POA: Diagnosis not present

## 2021-06-10 DIAGNOSIS — Z794 Long term (current) use of insulin: Secondary | ICD-10-CM | POA: Diagnosis not present

## 2021-06-10 DIAGNOSIS — J020 Streptococcal pharyngitis: Secondary | ICD-10-CM | POA: Diagnosis not present

## 2021-06-10 DIAGNOSIS — E119 Type 2 diabetes mellitus without complications: Secondary | ICD-10-CM | POA: Insufficient documentation

## 2021-06-10 DIAGNOSIS — Z91010 Allergy to peanuts: Secondary | ICD-10-CM | POA: Diagnosis not present

## 2021-06-10 DIAGNOSIS — Z7984 Long term (current) use of oral hypoglycemic drugs: Secondary | ICD-10-CM | POA: Insufficient documentation

## 2021-06-10 DIAGNOSIS — J029 Acute pharyngitis, unspecified: Secondary | ICD-10-CM | POA: Diagnosis present

## 2021-06-10 LAB — RESP PANEL BY RT-PCR (FLU A&B, COVID) ARPGX2
Influenza A by PCR: NEGATIVE
Influenza B by PCR: NEGATIVE
SARS Coronavirus 2 by RT PCR: NEGATIVE

## 2021-06-10 LAB — CBC WITH DIFFERENTIAL/PLATELET
Abs Immature Granulocytes: 0.07 10*3/uL (ref 0.00–0.07)
Basophils Absolute: 0.1 10*3/uL (ref 0.0–0.1)
Basophils Relative: 1 %
Eosinophils Absolute: 0.1 10*3/uL (ref 0.0–0.5)
Eosinophils Relative: 1 %
HCT: 35.8 % — ABNORMAL LOW (ref 36.0–46.0)
Hemoglobin: 12.1 g/dL (ref 12.0–15.0)
Immature Granulocytes: 1 %
Lymphocytes Relative: 18 %
Lymphs Abs: 2.4 10*3/uL (ref 0.7–4.0)
MCH: 27.1 pg (ref 26.0–34.0)
MCHC: 33.8 g/dL (ref 30.0–36.0)
MCV: 80.1 fL (ref 80.0–100.0)
Monocytes Absolute: 0.9 10*3/uL (ref 0.1–1.0)
Monocytes Relative: 7 %
Neutro Abs: 9.7 10*3/uL — ABNORMAL HIGH (ref 1.7–7.7)
Neutrophils Relative %: 72 %
Platelets: 392 10*3/uL (ref 150–400)
RBC: 4.47 MIL/uL (ref 3.87–5.11)
RDW: 12.8 % (ref 11.5–15.5)
WBC: 13.1 10*3/uL — ABNORMAL HIGH (ref 4.0–10.5)
nRBC: 0 % (ref 0.0–0.2)

## 2021-06-10 LAB — MONONUCLEOSIS SCREEN: Mono Screen: NEGATIVE

## 2021-06-10 LAB — BASIC METABOLIC PANEL
Anion gap: 14 (ref 5–15)
BUN: 12 mg/dL (ref 6–20)
CO2: 26 mmol/L (ref 22–32)
Calcium: 9.3 mg/dL (ref 8.9–10.3)
Chloride: 93 mmol/L — ABNORMAL LOW (ref 98–111)
Creatinine, Ser: 0.82 mg/dL (ref 0.44–1.00)
GFR, Estimated: >60 mL/min (ref >60)
Glucose, Bld: 442 mg/dL — ABNORMAL HIGH (ref 70–99)
Potassium: 3.9 mmol/L (ref 3.5–5.1)
Sodium: 133 mmol/L — ABNORMAL LOW (ref 135–145)

## 2021-06-10 LAB — GROUP A STREP BY PCR: Group A Strep by PCR: DETECTED — AB

## 2021-06-10 MED ORDER — SODIUM CHLORIDE 0.9 % IV BOLUS
1000.0000 mL | Freq: Once | INTRAVENOUS | Status: AC
  Administered 2021-06-10: 1000 mL via INTRAVENOUS

## 2021-06-10 MED ORDER — AMOXICILLIN 400 MG/5ML PO SUSR: 875.0000 mg | Freq: Two times a day (BID) | ORAL | 0 refills | Status: DC

## 2021-06-10 MED ORDER — PREDNISONE 10 MG (21) PO TBPK: ORAL_TABLET | ORAL | 0 refills | Status: DC

## 2021-06-10 MED ORDER — DEXAMETHASONE SODIUM PHOSPHATE 10 MG/ML IJ SOLN
10.0000 mg | Freq: Once | INTRAMUSCULAR | Status: AC
  Administered 2021-06-10: 10 mg via INTRAVENOUS
  Filled 2021-06-10: qty 1

## 2021-06-10 NOTE — ED Notes (Signed)
Pt with left ear and throat pain x 3 days. Pt states it hurts to swallow.

## 2021-06-10 NOTE — ED Triage Notes (Signed)
Pt reports coughing since Tuesday and had sore throat since Monday reports right ear pain. Pt talks in complete sentences no respiratory distress noted

## 2021-06-10 NOTE — ED Provider Notes (Signed)
[facility] Emergency Department Provider Note  ____________________________________________   Event Date/Time   First MD Initiated Contact with Patient 06/10/21 [phone]     (approximate)  I have reviewed the triage vital signs and the nursing notes.   HISTORY  Chief Complaint Sore Throat    HPI Elizabeth Blackburn is a 30 y.o. female presents emergency department complaining of a sore throat and inability to swallow.  Symptom's have been ongoing for 4 to 5 days.  Patient's is now she is having trouble swallowing and is having to spit her saliva out.  She denies any known fever.  No chest pain or shortness of breath.  No COVID exposure  Past Medical History:  Diagnosis Date   Diabetes mellitus without complication ([place])    MRSA (methicillin resistant Staphylococcus aureus)     Patient Active Problem List   Diagnosis Date Noted   Intertrigo 08/03/2016   Uncontrolled diabetes mellitus ([place]) 08/03/2016   Cellulitis 07/31/2016   Breast abscess 07/31/2016   Diabetes mellitus ([place]) 07/31/2016    Past Surgical History:  Procedure Laterality Date   IRRIGATION AND DEBRIDEMENT ABSCESS Right 07/31/2016   Procedure: IRRIGATION AND DEBRIDEMENT RIGHT BREAST ABSCESS;  Surgeon: Clovis Riley, MD;  Location: [facility] ORS;  Service: General;  Laterality: Right;    Prior to Admission medications   Medication Sig Start Date End Date Taking? Authorizing Provider  amoxicillin (AMOXIL) 400 MG/5ML suspension Take 10.9 mLs (875 mg total) by mouth 2 (two) times daily. 06/10/21  Yes Jamarea Selner, Linden Dolin, PA-C  predniSONE (STERAPRED UNI-PAK 21 TAB) 10 MG (21) TBPK tablet Take 6 pills on day one then decrease by 1 pill each day 06/10/21  Yes Caryn Section Linden Dolin, PA-C  blood glucose meter kit and supplies Dispense based on patient and insurance preference. Use up to four times daily as directed. (FOR ICD-9 250.00, 250.01). 08/03/16   Johnson, Clanford L, MD  cyclobenzaprine (FLEXERIL) 5 MG tablet  Take 1 tablet (5 mg total) by mouth 3 (three) times daily as needed. 05/30/21   Menshew, Dannielle Karvonen, PA-C  ibuprofen (ADVIL) 800 MG tablet Take 1 tablet (800 mg total) by mouth every 8 (eight) hours as needed for moderate pain. 05/27/21   Paulette Blanch, MD  insulin NPH-regular Human (NOVOLIN 70/30) (70-30) 100 UNIT/ML injection Inject 25 Units into the skin 2 (two) times daily with a meal. 06/05/17 07/05/17  Cardama, Grayce Sessions, MD  Insulin Syringe-Needle U-100 (INSULIN SYRINGE .3CC/31GX5/16") 31G X 5/16" 0.3 ML MISC Use as directed 08/03/16   Murlean Iba, MD  metFORMIN (GLUCOPHAGE-XR) 500 MG 24 hr tablet Take 1 tablet (500 mg total) by mouth 2 (two) times daily with a meal. 06/05/17 07/05/17  Cardama, Grayce Sessions, MD  metFORMIN (GLUMETZA) 1000 MG (MOD) 24 hr tablet Take 1,000 mg by mouth 2 (two) times daily with a meal.    [provider]  oxyCODONE (ROXICODONE) 5 MG immediate release tablet Take 1 tablet (5 mg total) by mouth every 6 (six) hours as needed for severe pain. 05/27/21   Paulette Blanch, MD    Allergies Peanut-containing drug products and Tylenol [acetaminophen]  No family history on file.  Social History Social History   Tobacco Use   Smoking status: Never   Smokeless tobacco: Never  Substance Use Topics   Alcohol use: Yes   Drug use: No    Review of Systems  Constitutional: No fever/chills Eyes: No visual changes. ENT: Positive sore throat. Respiratory: Denies cough  Cardiovascular: Denies chest pain Gastrointestinal: Denies abdominal pain Genitourinary: Negative for dysuria. Musculoskeletal: Negative for back pain. Skin: Negative for rash. Psychiatric: no mood changes,     ____________________________________________   PHYSICAL EXAM:  VITAL SIGNS: ED Triage Vitals  Enc Vitals Group     BP 06/10/21 0459 (!) 165/116     Pulse Rate 06/10/21 0459 (!) 122     Resp 06/10/21 0459 18     Temp 06/10/21 0459 98.4 F (36.9 C)     Temp Source  06/10/21 0459 Oral     SpO2 06/10/21 0459 100 %     Weight 06/10/21 0459 227 lb (103 kg)     Height 06/10/21 0459 5' 4"  (1.626 m)     Head Circumference --      Peak Flow --      Pain Score 06/10/21 0502 8     Pain Loc --      Pain Edu? --      Excl. in [place]? --     Constitutional: Alert and oriented. Well appearing and in no acute distress. Eyes: Conjunctivae are normal.  Head: Atraumatic. Nose: No congestion/rhinnorhea. Mouth/Throat: Mucous membranes are moist.  Throat is swollen with some exudate noted Neck:  supple no lymphadenopathy noted Cardiovascular: Normal rate, regular rhythm. Heart sounds are normal Respiratory: Normal respiratory effort.  No retractions, lungs c t a  GU: deferred Musculoskeletal: FROM all extremities, warm and well perfused Neurologic:  Normal speech and language.  Skin:  Skin is warm, dry and intact. No rash noted. Psychiatric: Mood and affect are normal. Speech and behavior are normal.  ____________________________________________   LABS (all labs ordered are listed, but only abnormal results are displayed)  Labs Reviewed  GROUP A STREP BY PCR - Abnormal; Notable for the following components:      Result Value   Group A Strep by PCR DETECTED (*)    All other components within normal limits  CBC WITH DIFFERENTIAL/PLATELET - Abnormal; Notable for the following components:   WBC 13.1 (*)    HCT 35.8 (*)    Neutro Abs 9.7 (*)    All other components within normal limits  BASIC METABOLIC PANEL - Abnormal; Notable for the following components:   Sodium 133 (*)    Chloride 93 (*)    Glucose, Bld 442 (*)    All other components within normal limits  RESP PANEL BY RT-PCR (FLU A&B, COVID) ARPGX2  MONONUCLEOSIS SCREEN   ____________________________________________   ____________________________________________  RADIOLOGY    ____________________________________________   PROCEDURES  Procedure(s) performed:  No  Procedures    ____________________________________________   INITIAL IMPRESSION / ASSESSMENT AND PLAN / ED COURSE  Pertinent labs & imaging results that were available during my care of the patient were reviewed by me and considered in my medical decision making (see chart for details).   Patient is a 30 year old female presents emergency department with difficulty swallowing secondary to sore throat.  See HPI.  Physical exam shows patient does have some dysphonia along with having to spit saliva frequently.  Throat is inflamed and swollen posteriorly with some exudate.  DDx: Peritonsillar abscess, strep throat, mono, retropharyngeal abscess  CBC shows elevated WBC of 13, basic metabolic panel is normal although her glucose is elevated but seems to be normal and the patient's trend, strep test is positive, monotest is negative, COVID test and flu test are negative  Chest x-ray ordered via triage reviewed by me confirmed by radiology to be negative for any  acute abnormality  I did explain all of the test results and findings to the patient.  She had been given normal saline 1 L IV along with Decadron 10 mg IV to decrease swelling.  Patient was given a prescription for Medrol Dosepak and given strict instructions to monitor her glucose levels frequently.  She was also given a prescription for amoxicillin liquid as she does not feel she can swallow the large pills.  She is to return to the emergency department if she is worsening.  She was discharged in stable condition.    Kei Langhorst was evaluated in Emergency Department on 06/10/2021 for the symptoms described in the history of present illness. She was evaluated in the context of the global COVID-19 pandemic, which necessitated consideration that the patient might be at risk for infection with the SARS-CoV-2 virus that causes COVID-19. Institutional protocols and algorithms that pertain to the evaluation of patients at risk for COVID-19  are in a state of rapid change based on information released by regulatory bodies including the CDC and federal and state organizations. These policies and algorithms were followed during the patient's care in the ED.    As part of my medical decision making, I reviewed the following data within the [facility] notes reviewed and incorporated, Labs reviewed , Old chart reviewed, Notes from prior ED visits, and  Controlled Substance Database  ____________________________________________   FINAL CLINICAL IMPRESSION(S) / ED DIAGNOSES  Final diagnoses:  Acute streptococcal pharyngitis      NEW MEDICATIONS STARTED DURING THIS VISIT:  Discharge Medication List as of 06/10/2021  9:50 AM     START taking these medications   Details  amoxicillin (AMOXIL) 400 MG/5ML suspension Take 10.9 mLs (875 mg total) by mouth 2 (two) times daily., Starting Sat 06/10/2021, Normal    predniSONE (STERAPRED UNI-PAK 21 TAB) 10 MG (21) TBPK tablet Take 6 pills on day one then decrease by 1 pill each day, Normal         Note:  This document was prepared using Dragon voice recognition software and may include unintentional dictation errors.    Versie Pistole, PA-C 06/10/21 1219    Lavonia Drafts, MD 06/10/21 1247

## 2021-06-10 NOTE — Discharge Instructions (Addendum)
Follow-up with your regular doctor if not improving in 2 to 3 days.  Return emergency department if worsening.  Take medication as prescribed

## 2021-08-14 ENCOUNTER — Encounter (HOSPITAL_COMMUNITY): Payer: Self-pay | Admitting: Emergency Medicine

## 2021-08-14 ENCOUNTER — Emergency Department (HOSPITAL_COMMUNITY): Payer: BC Managed Care – PPO

## 2021-08-14 ENCOUNTER — Other Ambulatory Visit: Payer: Self-pay

## 2021-08-14 ENCOUNTER — Emergency Department (HOSPITAL_COMMUNITY)
Admission: EM | Admit: 2021-08-14 | Discharge: 2021-08-15 | Disposition: A | Discharge status: Home / Self Care | Payer: BC Managed Care – PPO | Attending: Emergency Medicine | Admitting: Emergency Medicine

## 2021-08-14 DIAGNOSIS — R739 Hyperglycemia, unspecified: Secondary | ICD-10-CM

## 2021-08-14 DIAGNOSIS — R1011 Right upper quadrant pain: Secondary | ICD-10-CM | POA: Insufficient documentation

## 2021-08-14 DIAGNOSIS — D72829 Elevated white blood cell count, unspecified: Secondary | ICD-10-CM | POA: Insufficient documentation

## 2021-08-14 DIAGNOSIS — R000 Tachycardia, unspecified: Secondary | ICD-10-CM | POA: Insufficient documentation

## 2021-08-14 DIAGNOSIS — Z91010 Allergy to peanuts: Secondary | ICD-10-CM | POA: Insufficient documentation

## 2021-08-14 DIAGNOSIS — E1165 Type 2 diabetes mellitus with hyperglycemia: Secondary | ICD-10-CM | POA: Insufficient documentation

## 2021-08-14 DIAGNOSIS — Z7984 Long term (current) use of oral hypoglycemic drugs: Secondary | ICD-10-CM | POA: Insufficient documentation

## 2021-08-14 LAB — COMPREHENSIVE METABOLIC PANEL
ALT: 9 U/L (ref 0–44)
AST: 9 U/L — ABNORMAL LOW (ref 15–41)
Albumin: 3 g/dL — ABNORMAL LOW (ref 3.5–5.0)
Alkaline Phosphatase: 73 U/L (ref 38–126)
Anion gap: 6 (ref 5–15)
BUN: 10 mg/dL (ref 6–20)
CO2: 27 mmol/L (ref 22–32)
Calcium: 8.7 mg/dL — ABNORMAL LOW (ref 8.9–10.3)
Chloride: 98 mmol/L (ref 98–111)
Creatinine, Ser: 0.75 mg/dL (ref 0.44–1.00)
GFR, Estimated: >60 mL/min (ref >60)
Glucose, Bld: 414 mg/dL — ABNORMAL HIGH (ref 70–99)
Potassium: 3.8 mmol/L (ref 3.5–5.1)
Sodium: 131 mmol/L — ABNORMAL LOW (ref 135–145)
Total Bilirubin: 0.1 mg/dL — ABNORMAL LOW (ref 0.3–1.2)
Total Protein: 7.1 g/dL (ref 6.5–8.1)

## 2021-08-14 LAB — CBC WITH DIFFERENTIAL/PLATELET
Abs Immature Granulocytes: 0.04 10*3/uL (ref 0.00–0.07)
Basophils Absolute: 0.1 10*3/uL (ref 0.0–0.1)
Basophils Relative: 1 %
Eosinophils Absolute: 0.2 10*3/uL (ref 0.0–0.5)
Eosinophils Relative: 2 %
HCT: 33.6 % — ABNORMAL LOW (ref 36.0–46.0)
Hemoglobin: 11.2 g/dL — ABNORMAL LOW (ref 12.0–15.0)
Immature Granulocytes: 0 %
Lymphocytes Relative: 32 %
Lymphs Abs: 3.5 10*3/uL (ref 0.7–4.0)
MCH: 27 pg (ref 26.0–34.0)
MCHC: 33.3 g/dL (ref 30.0–36.0)
MCV: 81 fL (ref 80.0–100.0)
Monocytes Absolute: 0.8 10*3/uL (ref 0.1–1.0)
Monocytes Relative: 7 %
Neutro Abs: 6.6 10*3/uL (ref 1.7–7.7)
Neutrophils Relative %: 58 %
Platelets: 364 10*3/uL (ref 150–400)
RBC: 4.15 MIL/uL (ref 3.87–5.11)
RDW: 13.3 % (ref 11.5–15.5)
WBC: 11.1 10*3/uL — ABNORMAL HIGH (ref 4.0–10.5)
nRBC: 0 % (ref 0.0–0.2)

## 2021-08-14 LAB — I-STAT BETA HCG BLOOD, ED (MC, WL, AP ONLY): I-stat hCG, quantitative: <5 m[IU]/mL (ref <5)

## 2021-08-14 LAB — LIPASE, BLOOD: Lipase: 22 U/L (ref 11–51)

## 2021-08-14 NOTE — ED Provider Notes (Signed)
Emergency Medicine Provider Triage Evaluation Note  Elizabeth Blackburn , a 30 y.o. female  was evaluated in triage.  Pt complains of right upper quadrant pain that started around 0530 this morning.  Pain worse after eating.  Denies associated nausea, vomiting, or diarrhea.  No urinary symptoms.  No chest pain or shortness of breath.  No previous abdominal operations. Endorses occasional alcohol use. No chronic NSAIDs.  Review of Systems  Positive: Abdominal pain Negative: CP  Physical Exam  BP (!) 143/105 (BP Location: Right Arm)   Pulse (!) 116   Temp 98.2 F (36.8 C) (Oral)   Resp 18   Ht 5\' 4"  (1.626 m)   Wt 97.5 kg   SpO2 100%   BMI 36.90 kg/m  Gen:   Awake, no distress   Resp:  Normal effort  MSK:   Moves extremities without difficulty  Other:  +RUQ ttp  Medical Decision Making  Medically screening exam initiated at 8:02 PM.  Appropriate orders placed.  Elizabeth Blackburn was informed that the remainder of the evaluation will be completed by another provider, this initial triage assessment does not replace that evaluation, and the importance of remaining in the ED until their evaluation is complete.  Probably gallbladder etiology Labs RUQ [name]   [place] 08/14/21 2004    2005, MD 08/14/21 2021

## 2021-08-14 NOTE — ED Triage Notes (Signed)
Patient states she began having RUQ pain at 0530 this morning. Gallbladder in tact. Pain is worse after eating.

## 2021-08-15 LAB — URINALYSIS, ROUTINE W REFLEX MICROSCOPIC
Bilirubin Urine: NEGATIVE
Glucose, UA: >=500 mg/dL — AB
Ketones, ur: NEGATIVE mg/dL
Nitrite: POSITIVE — AB
Protein, ur: 100 mg/dL — AB
Specific Gravity, Urine: 1.026 (ref 1.005–1.030)
WBC, UA: >50 WBC/hpf — ABNORMAL HIGH (ref 0–5)
pH: 5 (ref 5.0–8.0)

## 2021-08-15 LAB — CBG MONITORING, ED: Glucose-Capillary: 339 mg/dL — ABNORMAL HIGH (ref 70–99)

## 2021-08-15 MED ORDER — ONDANSETRON HCL 4 MG/2ML IJ SOLN
4.0000 mg | Freq: Once | INTRAMUSCULAR | Status: AC
  Administered 2021-08-15: 4 mg via INTRAVENOUS
  Filled 2021-08-15: qty 2

## 2021-08-15 MED ORDER — SODIUM CHLORIDE 0.9 % IV BOLUS
1000.0000 mL | Freq: Once | INTRAVENOUS | Status: AC
  Administered 2021-08-15: 1000 mL via INTRAVENOUS

## 2021-08-15 MED ORDER — PANTOPRAZOLE SODIUM 40 MG IV SOLR
40.0000 mg | Freq: Once | INTRAVENOUS | Status: AC
  Administered 2021-08-15: 40 mg via INTRAVENOUS
  Filled 2021-08-15: qty 40

## 2021-08-15 MED ORDER — FENTANYL CITRATE PF 50 MCG/ML IJ SOSY
50.0000 ug | PREFILLED_SYRINGE | Freq: Once | INTRAMUSCULAR | Status: AC
  Administered 2021-08-15: 50 ug via INTRAVENOUS
  Filled 2021-08-15: qty 1

## 2021-08-15 NOTE — ED Provider Notes (Signed)
[place] DEPT Provider Note   CSN: [id number] Arrival date & time: 08/14/21  3007     History Chief Complaint  Patient presents with   Abdominal Pain    Elizabeth Blackburn is a 30 y.o. female sent to the emergency department with complaints of right upper quadrant abdominal pain that began approximately 24 hours ago.  Patient reports the pain is worse after eating.  Denies association with nausea, vomiting or diarrhea.  Also denies fever, chills, urinary symptoms, known sick contacts.  She has no chest pain or shortness of breath.  She has never had any previous abdominal operations.  Does endorse occasional alcohol use but denies chronic NSAID use.  Does have history of diabetes reports blood sugars normally run in the 180s.  Reports that pain has improved significantly but has not resolved.  No treatments prior to arrival.  The history is provided by the patient and medical records. No language interpreter was used.      Past Medical History:  Diagnosis Date   Diabetes mellitus without complication ([place])    MRSA (methicillin resistant Staphylococcus aureus)     Patient Active Problem List   Diagnosis Date Noted   Intertrigo 08/03/2016   Uncontrolled diabetes mellitus 08/03/2016   Cellulitis 07/31/2016   Breast abscess 07/31/2016   Diabetes mellitus ([place]) 07/31/2016    Past Surgical History:  Procedure Laterality Date   IRRIGATION AND DEBRIDEMENT ABSCESS Right 07/31/2016   Procedure: IRRIGATION AND DEBRIDEMENT RIGHT BREAST ABSCESS;  Surgeon: Clovis Riley, MD;  Location: [facility];  Service: General;  Laterality: Right;     OB History   No obstetric history on file.     No family history on file.  Social History   Tobacco Use   Smoking status: Never   Smokeless tobacco: Never  Substance Use Topics   Alcohol use: Yes   Drug use: No    Home Medications Prior to Admission medications   Medication Sig Start Date End Date Taking?  Authorizing Provider  amoxicillin (AMOXIL) 400 MG/5ML suspension Take 10.9 mLs (875 mg total) by mouth 2 (two) times daily. 06/10/21   Versie Villagran, PA-C  blood glucose meter kit and supplies Dispense based on patient and insurance preference. Use up to four times daily as directed. (FOR ICD-9 250.00, 250.01). 08/03/16   Johnson, Clanford L, MD  cyclobenzaprine (FLEXERIL) 5 MG tablet Take 1 tablet (5 mg total) by mouth 3 (three) times daily as needed. 05/30/21   Menshew, Dannielle Karvonen, PA-C  ibuprofen (ADVIL) 800 MG tablet Take 1 tablet (800 mg total) by mouth every 8 (eight) hours as needed for moderate pain. 05/27/21   Paulette Blanch, MD  insulin NPH-regular Human (NOVOLIN 70/30) (70-30) 100 UNIT/ML injection Inject 25 Units into the skin 2 (two) times daily with a meal. 06/05/17 07/05/17  Cardama, Grayce Sessions, MD  Insulin Syringe-Needle U-100 (INSULIN SYRINGE .3CC/31GX5/16") 31G X 5/16" 0.3 ML MISC Use as directed 08/03/16   Murlean Iba, MD  metFORMIN (GLUCOPHAGE-XR) 500 MG 24 hr tablet Take 1 tablet (500 mg total) by mouth 2 (two) times daily with a meal. 06/05/17 07/05/17  Cardama, Grayce Sessions, MD  metFORMIN (GLUMETZA) 1000 MG (MOD) 24 hr tablet Take 1,000 mg by mouth 2 (two) times daily with a meal.    [provider]  oxyCODONE (ROXICODONE) 5 MG immediate release tablet Take 1 tablet (5 mg total) by mouth every 6 (six) hours as needed for severe pain. 05/27/21  Paulette Blanch, MD  predniSONE (STERAPRED UNI-PAK 21 TAB) 10 MG (21) TBPK tablet Take 6 pills on day one then decrease by 1 pill each day 06/10/21   Versie Tsuda, PA-C    Allergies    Peanut-containing drug products and Tylenol [acetaminophen]  Review of Systems   Review of Systems  Constitutional:  Negative for appetite change, diaphoresis, fatigue, fever and unexpected weight change.  HENT:  Negative for mouth sores.   Eyes:  Negative for visual disturbance.  Respiratory:  Negative for cough, chest tightness,  shortness of breath and wheezing.   Cardiovascular:  Negative for chest pain.  Gastrointestinal:  Positive for abdominal pain and nausea. Negative for constipation, diarrhea and vomiting.  Endocrine: Negative for polydipsia, polyphagia and polyuria.  Genitourinary:  Negative for dysuria, frequency, hematuria and urgency.  Musculoskeletal:  Negative for back pain and neck stiffness.  Skin:  Negative for rash.  Allergic/Immunologic: Negative for immunocompromised state.  Neurological:  Negative for syncope, light-headedness and headaches.  Hematological:  Does not bruise/bleed easily.  Psychiatric/Behavioral:  Negative for sleep disturbance. The patient is not nervous/anxious.    Physical Exam Updated Vital Signs BP (!) 169/107   Pulse (!) 108   Temp (!) 97.5 F (36.4 C) (Oral)   Resp 18   Ht 5' 4"  (1.626 m)   Wt 97.5 kg   LMP 08/03/2021 (Exact Date)   SpO2 100%   BMI 36.90 kg/m   Physical Exam Vitals and nursing note reviewed.  Constitutional:      General: She is not in acute distress.    Appearance: She is not diaphoretic.  HENT:     Head: Normocephalic.  Eyes:     General: No scleral icterus.    Conjunctiva/sclera: Conjunctivae normal.  Cardiovascular:     Rate and Rhythm: Regular rhythm. Tachycardia present.     Pulses: Normal pulses.          Radial pulses are 2+ on the right side and 2+ on the left side.  Pulmonary:     Effort: Pulmonary effort is normal. No tachypnea, accessory muscle usage, prolonged expiration, respiratory distress or retractions.     Breath sounds: No stridor.     Comments: Equal chest rise. No increased work of breathing. Abdominal:     General: There is no distension.     Palpations: Abdomen is soft.     Tenderness: There is abdominal tenderness in the right upper quadrant. There is no right CVA tenderness, left CVA tenderness, guarding or rebound.  Musculoskeletal:     Cervical back: Normal range of motion.     Comments: Moves all  extremities equally and without difficulty.  Skin:    General: Skin is warm and dry.     Capillary Refill: Capillary refill takes less than 2 seconds.  Neurological:     Mental Status: She is alert.     GCS: GCS eye subscore is 4. GCS verbal subscore is 5. GCS motor subscore is 6.     Comments: Speech is clear and goal oriented.  Psychiatric:        Mood and Affect: Mood normal.    ED Results / Procedures / Treatments   Labs (all labs ordered are listed, but only abnormal results are displayed) Labs Reviewed  CBC WITH DIFFERENTIAL/PLATELET - Abnormal; Notable for the following components:      Result Value   WBC 11.1 (*)    Hemoglobin 11.2 (*)    HCT 33.6 (*)  All other components within normal limits  COMPREHENSIVE METABOLIC PANEL - Abnormal; Notable for the following components:   Sodium 131 (*)    Glucose, Bld 414 (*)    Calcium 8.7 (*)    Albumin 3.0 (*)    AST 9 (*)    Total Bilirubin 0.1 (*)    All other components within normal limits  CBG MONITORING, ED - Abnormal; Notable for the following components:   Glucose-Capillary 339 (*)    All other components within normal limits  LIPASE, BLOOD  URINALYSIS, ROUTINE W REFLEX MICROSCOPIC  I-STAT BETA HCG BLOOD, ED ([facility], [facility], [facility] ONLY)     Radiology US Abdomen Limited RUQ (LIVER/GB)  Result Date: 08/14/2021 CLINICAL DATA:  Right upper quadrant pain. EXAM: ULTRASOUND ABDOMEN LIMITED RIGHT UPPER QUADRANT COMPARISON:  None. FINDINGS: Gallbladder: No gallstones or wall thickening visualized (2.3 mm). No sonographic Murphy sign noted by sonographer. Common bile duct: Diameter: 3.1 mm Liver: No focal lesion identified. Within normal limits in parenchymal echogenicity. Portal vein is patent on color Doppler imaging with normal direction of blood flow towards the liver. Other: None. IMPRESSION: Normal right upper quadrant ultrasound. Electronically Signed   By: Virgina Norfolk M.D.   On: 08/14/2021 20:55    Procedures Procedures    Medications Ordered in ED Medications  sodium chloride 0.9 % bolus 1,000 mL (1,000 mLs Intravenous New Bag/Given 08/15/21 0556)  ondansetron (ZOFRAN) injection 4 mg (4 mg Intravenous Given 08/15/21 0556)  pantoprazole (PROTONIX) injection 40 mg (40 mg Intravenous Given 08/15/21 0556)  fentaNYL (SUBLIMAZE) injection 50 mcg (50 mcg Intravenous Given 08/15/21 1610)    ED Course  I have reviewed the triage vital signs and the nursing notes.  Pertinent labs & imaging results that were available during my care of the patient were reviewed by me and considered in my medical decision making (see chart for details).  Clinical Course as of 08/15/21 9604  Tue Aug 15, 2021  0619 Hemoglobin(!): 11.2 Baseline [HM]    Clinical Course User Index [HM] Keelin Sheridan, Gwenlyn Perking   MDM Rules/Calculators/A&P                           Patient presents with right upper quadrant abdominal pain.  My exam she does report some nausea.  No vomiting.  No guarding or rebound.  Labs are overall reassuring.  Glucose is elevated at 414.  Mild leukocytosis.  Lipase and liver enzymes are within normal limits.  6:19 AM Patient given fluid bolus, Zofran, Pepcid for symptom management.  Pending UA.  Patient will be signed out to the oncoming team who will reassess, follow labs and determine disposition.    Final Clinical Impression(s) / ED Diagnoses Final diagnoses:  RUQ pain  Hyperglycemia    Rx / DC Orders ED Discharge Orders     None        Loni Muse Gwenlyn Perking 54/09/81 1914    Delora Fuel, MD 78/29/56 [phone]

## 2021-08-15 NOTE — Discharge Instructions (Signed)
You were seen and evaluated in the emergency room today for for evaluation of right upper quadrant abdominal pain and elevated blood glucose levels.  As we discussed, you need to establish care with a primary care doctor as your medications might need to be adjusted.  Please return to the emergency department if you experience worsening abdominal pain, intractable nausea and vomiting, fever, trouble controlling your blood sugars, or any other concerns you might have.

## 2021-08-15 NOTE — ED Provider Notes (Signed)
  Physical Exam  BP (!) 173/105   Pulse 87   Temp (!) 97.5 F (36.4 C) (Oral)   Resp 16   Ht 5\' 4"  (1.626 m)   Wt 97.5 kg   LMP 08/03/2021 (Exact Date)   SpO2 98%   BMI 36.90 kg/m   Physical Exam Vitals and nursing note reviewed.  Constitutional:      General: She is not in acute distress.    Appearance: Normal appearance.  HENT:     Head: Normocephalic and atraumatic.  Eyes:     General:        Right eye: No discharge.        Left eye: No discharge.  Cardiovascular:     Comments: Regular rate and rhythm.  S1/S2 are distinct without any evidence of murmur, rubs, or gallops.  Radial pulses are 2+ bilaterally.  Dorsalis pedis pulses are 2+ bilaterally.  No evidence of pedal edema. Pulmonary:     Comments: Clear to auscultation bilaterally.  Normal effort.  No respiratory distress.  No evidence of wheezes, rales, or rhonchi heard throughout. Abdominal:     General: Abdomen is flat. Bowel sounds are normal. There is no distension.     Tenderness: There is no abdominal tenderness. There is no guarding or rebound.  Musculoskeletal:        General: Normal range of motion.     Cervical back: Neck supple.  Skin:    General: Skin is warm and dry.     Findings: No rash.  Neurological:     General: No focal deficit present.     Mental Status: She is alert.  Psychiatric:        Mood and Affect: Mood normal.        Behavior: Behavior normal.    ED Course/Procedures   Clinical Course as of 08/15/21 0727  Tue Aug 15, 2021  0619 Hemoglobin(!): 11.2 Baseline [HM]    Clinical Course User Index [HM] Muthersbaugh, Aug 17, 2021, PA-C    Procedures  MDM  Accepted handoff at shift change from Muthersbaugh PA-C. Please see prior provider note for more detail.   Briefly: Patient is 30 y.o. who presents to the emergency department with complaints of right upper quadrant abdominal pain.  Blood sugars found to be in the 400s.  DDX: concern for cholecystitis or other hepatobiliary causes.   Also consider HHS versus DKA.  Plan: Labs are ultimately came back unremarkable.  Ultrasound was negative.  CBC showed mild leukocytosis and chronic ongoing anemia.  CMP was unremarkable apart from hyperglycemia.  Potassium was normal. Pregnancy was negative.  Lipase negative. 38 was negative. UA shows large amount of glucose and signs of infection.  Patient is asymptomatic and has no urinary symptoms.  Do not feel that antibiotics are necessary given that she is asymptomatic.  Gave her strict return precautions in the event that she does become symptomatic to be placed on antibiotics.  No evidence of ketones.  DKA is less likely. Glucose is not above 600 and bicarb is normal. Has been mentating fine during her stay. No neurological symptoms. HHS Patient states she is feeling better.  She is safe for discharge.          [place], PA-C 08/15/21 08/17/21    3244, MD 08/15/21 [phone]

## 2021-08-26 ENCOUNTER — Emergency Department (HOSPITAL_COMMUNITY): Payer: BC Managed Care – PPO

## 2021-08-26 ENCOUNTER — Encounter (HOSPITAL_COMMUNITY): Payer: Self-pay

## 2021-08-26 ENCOUNTER — Other Ambulatory Visit: Payer: Self-pay

## 2021-08-26 ENCOUNTER — Emergency Department (HOSPITAL_COMMUNITY)
Admission: EM | Admit: 2021-08-26 | Discharge: 2021-08-26 | Disposition: A | Discharge status: Home / Self Care | Payer: BC Managed Care – PPO | Attending: Emergency Medicine | Admitting: Emergency Medicine

## 2021-08-26 DIAGNOSIS — E119 Type 2 diabetes mellitus without complications: Secondary | ICD-10-CM | POA: Diagnosis not present

## 2021-08-26 DIAGNOSIS — N390 Urinary tract infection, site not specified: Secondary | ICD-10-CM

## 2021-08-26 DIAGNOSIS — R112 Nausea with vomiting, unspecified: Secondary | ICD-10-CM | POA: Diagnosis not present

## 2021-08-26 DIAGNOSIS — R1011 Right upper quadrant pain: Secondary | ICD-10-CM | POA: Diagnosis present

## 2021-08-26 DIAGNOSIS — Z7984 Long term (current) use of oral hypoglycemic drugs: Secondary | ICD-10-CM | POA: Diagnosis not present

## 2021-08-26 DIAGNOSIS — R195 Other fecal abnormalities: Secondary | ICD-10-CM | POA: Diagnosis not present

## 2021-08-26 DIAGNOSIS — Z794 Long term (current) use of insulin: Secondary | ICD-10-CM | POA: Diagnosis not present

## 2021-08-26 LAB — URINALYSIS, ROUTINE W REFLEX MICROSCOPIC
Bilirubin Urine: NEGATIVE
Glucose, UA: >=500 mg/dL — AB
Ketones, ur: NEGATIVE mg/dL
Nitrite: NEGATIVE
Protein, ur: 100 mg/dL — AB
Specific Gravity, Urine: 1.035 — ABNORMAL HIGH (ref 1.005–1.030)
WBC, UA: >50 WBC/hpf — ABNORMAL HIGH (ref 0–5)
pH: 5 (ref 5.0–8.0)

## 2021-08-26 LAB — COMPREHENSIVE METABOLIC PANEL
ALT: 10 U/L (ref 0–44)
AST: 9 U/L — ABNORMAL LOW (ref 15–41)
Albumin: 3.1 g/dL — ABNORMAL LOW (ref 3.5–5.0)
Alkaline Phosphatase: 68 U/L (ref 38–126)
Anion gap: 6 (ref 5–15)
BUN: 12 mg/dL (ref 6–20)
CO2: 26 mmol/L (ref 22–32)
Calcium: 8.7 mg/dL — ABNORMAL LOW (ref 8.9–10.3)
Chloride: 101 mmol/L (ref 98–111)
Creatinine, Ser: 0.57 mg/dL (ref 0.44–1.00)
GFR, Estimated: >60 mL/min (ref >60)
Glucose, Bld: 382 mg/dL — ABNORMAL HIGH (ref 70–99)
Potassium: 4.2 mmol/L (ref 3.5–5.1)
Sodium: 133 mmol/L — ABNORMAL LOW (ref 135–145)
Total Bilirubin: 0.3 mg/dL (ref 0.3–1.2)
Total Protein: 7.8 g/dL (ref 6.5–8.1)

## 2021-08-26 LAB — CBC WITH DIFFERENTIAL/PLATELET
Abs Immature Granulocytes: 0.05 10*3/uL (ref 0.00–0.07)
Basophils Absolute: 0 10*3/uL (ref 0.0–0.1)
Basophils Relative: 0 %
Eosinophils Absolute: 0.2 10*3/uL (ref 0.0–0.5)
Eosinophils Relative: 2 %
HCT: 33.2 % — ABNORMAL LOW (ref 36.0–46.0)
Hemoglobin: 11 g/dL — ABNORMAL LOW (ref 12.0–15.0)
Immature Granulocytes: 1 %
Lymphocytes Relative: 26 %
Lymphs Abs: 2.7 10*3/uL (ref 0.7–4.0)
MCH: 27 pg (ref 26.0–34.0)
MCHC: 33.1 g/dL (ref 30.0–36.0)
MCV: 81.4 fL (ref 80.0–100.0)
Monocytes Absolute: 0.8 10*3/uL (ref 0.1–1.0)
Monocytes Relative: 7 %
Neutro Abs: 6.7 10*3/uL (ref 1.7–7.7)
Neutrophils Relative %: 64 %
Platelets: 381 10*3/uL (ref 150–400)
RBC: 4.08 MIL/uL (ref 3.87–5.11)
RDW: 13.1 % (ref 11.5–15.5)
WBC: 10.5 10*3/uL (ref 4.0–10.5)
nRBC: 0 % (ref 0.0–0.2)

## 2021-08-26 LAB — LIPASE, BLOOD: Lipase: 23 U/L (ref 11–51)

## 2021-08-26 LAB — I-STAT BETA HCG BLOOD, ED (MC, WL, AP ONLY): I-stat hCG, quantitative: <5 m[IU]/mL (ref <5)

## 2021-08-26 MED ORDER — ONDANSETRON HCL 4 MG/2ML IJ SOLN
4.0000 mg | Freq: Once | INTRAMUSCULAR | Status: AC
  Administered 2021-08-26: 4 mg via INTRAVENOUS
  Filled 2021-08-26: qty 2

## 2021-08-26 MED ORDER — IOHEXOL 350 MG/ML SOLN
80.0000 mL | Freq: Once | INTRAVENOUS | Status: AC | PRN
  Administered 2021-08-26: 80 mL via INTRAVENOUS

## 2021-08-26 MED ORDER — SODIUM CHLORIDE 0.9 % IV SOLN
1.0000 g | Freq: Once | INTRAVENOUS | Status: AC
  Administered 2021-08-26: 1 g via INTRAVENOUS
  Filled 2021-08-26: qty 10

## 2021-08-26 MED ORDER — CEPHALEXIN 500 MG PO CAPS: 500.0000 mg | ORAL_CAPSULE | Freq: Four times a day (QID) | ORAL | 0 refills | Status: AC

## 2021-08-26 MED ORDER — SODIUM CHLORIDE 0.9 % IV BOLUS (SEPSIS)
1000.0000 mL | Freq: Once | INTRAVENOUS | Status: AC
  Administered 2021-08-26: 1000 mL via INTRAVENOUS

## 2021-08-26 MED ORDER — SODIUM CHLORIDE 0.9 % IV SOLN
1000.0000 mL | INTRAVENOUS | Status: DC
  Administered 2021-08-26: 1000 mL via INTRAVENOUS

## 2021-08-26 MED ORDER — KETOROLAC TROMETHAMINE 30 MG/ML IJ SOLN
30.0000 mg | Freq: Once | INTRAMUSCULAR | Status: AC
  Administered 2021-08-26: 30 mg via INTRAVENOUS
  Filled 2021-08-26: qty 1

## 2021-08-26 NOTE — ED Provider Notes (Signed)
Patient seen after prior EDP.  Patient has remained pain-free during her exam and work-up time.  CT without significant acute abdominal pathology noted.  Pelvic ultrasound demonstrates likely hemorrhagic cyst on the right ovary.  No evidence of torsion or other ovarian pathology.  UA is demonstrative of likely UTI.  Patient reports frequent UTIs in the past.  Her last UTI was approximately 6 months prior.  She is not symptomatic as to UTI.  Patient desires discharge home.  Importance of close follow-up stressed.   Wynetta Fines, MD 08/26/21 1919

## 2021-08-26 NOTE — ED Triage Notes (Signed)
Pt. Arrived POV C/o RUQ pain x2 weeks. She sates that she was seen for the pain when it first started, but it has not been relieved. She is also experiencing vomiting and diarrhea.

## 2021-08-26 NOTE — Discharge Instructions (Signed)
Return for any problem.  ?

## 2021-08-26 NOTE — ED Provider Notes (Signed)
Emergency Medicine Provider Triage Evaluation Note  Elizabeth Blackburn , a 30 y.o. female  was evaluated in triage.  Pt complains of right upper quadrant abdominal pain that has been intermittent over the last couple weeks.  Was seen evaluated in the emergency department at the beginning of her course had a negative right upper quadrant ultrasound.  Pain has been persistent and worsening.  Is been having associated nausea and vomiting.  Review of Systems  Positive:  Negative: Fever, chills, diarrhea, urinary complaints  Physical Exam  BP (!) 168/104 (BP Location: Left Arm)   Pulse 97   Temp 98 F (36.7 C) (Oral)   Resp 16   LMP 08/03/2021 (Exact Date)   SpO2 100%  Gen:   Awake, no distress   Resp:  Normal effort  MSK:   Moves extremities without difficulty  Other:  Moderate right upper quadrant tenderness  Medical Decision Making  Medically screening exam initiated at 11:41 AM.  Appropriate orders placed.  Elizabeth Blackburn was informed that the remainder of the evaluation will be completed by another provider, this initial triage assessment does not replace that evaluation, and the importance of remaining in the ED until their evaluation is complete.     Elizabeth Loh [place], PA-C 08/26/21 1142    Linwood Dibbles, MD 08/26/21 [phone]

## 2021-08-26 NOTE — ED Provider Notes (Signed)
[facility] DEPT Provider Note   CSN: [id number] Arrival date & time: 08/26/21  1113     History Chief Complaint  Patient presents with   Abdominal Pain    Elizabeth Blackburn is a 30 y.o. female.   Abdominal Pain  Patient presents to the ED for evaluation of intermittent upper abdominal pain.  Patient started having symptoms at the end of October.  She was actually seen in the emergency room on October 24.  Patient states she continues to have pain since that evaluation.  Patient gets sharp pain in her right upper abdomen.  It will be intense and likely double over.  She seems to notice it more after eating.  Pain eventually resolves and she will be fine afterwards.  Patient states she has had nausea and vomiting for the last couple of days.  She has had some loose stools as well.  No urinary symptoms. Past Medical History:  Diagnosis Date   Diabetes mellitus without complication ([place])    MRSA (methicillin resistant Staphylococcus aureus)     Patient Active Problem List   Diagnosis Date Noted   Intertrigo 08/03/2016   Uncontrolled diabetes mellitus 08/03/2016   Cellulitis 07/31/2016   Breast abscess 07/31/2016   Diabetes mellitus ([place]) 07/31/2016    Past Surgical History:  Procedure Laterality Date   IRRIGATION AND DEBRIDEMENT ABSCESS Right 07/31/2016   Procedure: IRRIGATION AND DEBRIDEMENT RIGHT BREAST ABSCESS;  Surgeon: Clovis Riley, MD;  Location: [facility];  Service: General;  Laterality: Right;     OB History   No obstetric history on file.     History reviewed. No pertinent family history.  Social History   Tobacco Use   Smoking status: Never   Smokeless tobacco: Never  Substance Use Topics   Alcohol use: Yes   Drug use: No    Home Medications Prior to Admission medications   Medication Sig Start Date End Date Taking? Authorizing Provider  amoxicillin (AMOXIL) 400 MG/5ML suspension Take 10.9 mLs (875 mg total) by mouth 2  (two) times daily. 06/10/21   Versie Defina, PA-C  blood glucose meter kit and supplies Dispense based on patient and insurance preference. Use up to four times daily as directed. (FOR ICD-9 250.00, 250.01). 08/03/16   Johnson, Clanford L, MD  cyclobenzaprine (FLEXERIL) 5 MG tablet Take 1 tablet (5 mg total) by mouth 3 (three) times daily as needed. 05/30/21   Menshew, Dannielle Karvonen, PA-C  ibuprofen (ADVIL) 800 MG tablet Take 1 tablet (800 mg total) by mouth every 8 (eight) hours as needed for moderate pain. 05/27/21   Paulette Blanch, MD  insulin NPH-regular Human (NOVOLIN 70/30) (70-30) 100 UNIT/ML injection Inject 25 Units into the skin 2 (two) times daily with a meal. 06/05/17 07/05/17  Cardama, Grayce Sessions, MD  Insulin Syringe-Needle U-100 (INSULIN SYRINGE .3CC/31GX5/16") 31G X 5/16" 0.3 ML MISC Use as directed 08/03/16   Murlean Iba, MD  metFORMIN (GLUCOPHAGE-XR) 500 MG 24 hr tablet Take 1 tablet (500 mg total) by mouth 2 (two) times daily with a meal. 06/05/17 07/05/17  Cardama, Grayce Sessions, MD  metFORMIN (GLUMETZA) 1000 MG (MOD) 24 hr tablet Take 1,000 mg by mouth 2 (two) times daily with a meal.    [provider]  oxyCODONE (ROXICODONE) 5 MG immediate release tablet Take 1 tablet (5 mg total) by mouth every 6 (six) hours as needed for severe pain. 05/27/21   Paulette Blanch, MD  predniSONE (STERAPRED UNI-PAK 21 TAB)  10 MG (21) TBPK tablet Take 6 pills on day one then decrease by 1 pill each day 06/10/21   Versie Fernholz, PA-C    Allergies    Peanut-containing drug products and Tylenol [acetaminophen]  Review of Systems   Review of Systems  Gastrointestinal:  Positive for abdominal pain.  All other systems reviewed and are negative.  Physical Exam Updated Vital Signs BP (!) 168/104 (BP Location: Left Arm)   Pulse 97   Temp 98 F (36.7 C) (Oral)   Resp 16   Ht 1.626 m (_0 )   Wt 100.2 kg   LMP 08/03/2021 (Exact Date)   SpO2 100%   BMI 37.93 kg/m   Physical  Exam Vitals and nursing note reviewed.  Constitutional:      General: She is not in acute distress.    Appearance: She is well-developed.  HENT:     Head: Normocephalic and atraumatic.     Right Ear: External ear normal.     Left Ear: External ear normal.  Eyes:     General: No scleral icterus.       Right eye: No discharge.        Left eye: No discharge.     Conjunctiva/sclera: Conjunctivae normal.  Neck:     Trachea: No tracheal deviation.  Cardiovascular:     Rate and Rhythm: Normal rate and regular rhythm.  Pulmonary:     Effort: Pulmonary effort is normal. No respiratory distress.     Breath sounds: Normal breath sounds. No stridor. No wheezing or rales.  Abdominal:     General: Bowel sounds are normal. There is no distension.     Palpations: Abdomen is soft.     Tenderness: There is abdominal tenderness in the right upper quadrant. There is no guarding or rebound.  Musculoskeletal:        General: No tenderness or deformity.     Cervical back: Neck supple.  Skin:    General: Skin is warm and dry.     Findings: No rash.  Neurological:     General: No focal deficit present.     Mental Status: She is alert.     Cranial Nerves: No cranial nerve deficit (no facial droop, extraocular movements intact, no slurred speech).     Sensory: No sensory deficit.     Motor: No abnormal muscle tone or seizure activity.     Coordination: Coordination normal.  Psychiatric:        Mood and Affect: Mood normal.    ED Results / Procedures / Treatments   Labs (all labs ordered are listed, but only abnormal results are displayed) Labs Reviewed  COMPREHENSIVE METABOLIC PANEL - Abnormal; Notable for the following components:      Result Value   Sodium 133 (*)    Glucose, Bld 382 (*)    Calcium 8.7 (*)    Albumin 3.1 (*)    AST 9 (*)    All other components within normal limits  CBC WITH DIFFERENTIAL/PLATELET - Abnormal; Notable for the following components:   Hemoglobin 11.0 (*)     HCT 33.2 (*)    All other components within normal limits  LIPASE, BLOOD  I-STAT BETA HCG BLOOD, ED ([facility], [facility], [facility] ONLY)    EKG None  Radiology No results found.  Procedures Procedures   Medications Ordered in ED Medications  ondansetron (ZOFRAN) injection 4 mg (has no administration in time range)  sodium chloride 0.9 % bolus 1,000 mL (has no  administration in time range)    Followed by  0.9 %  sodium chloride infusion (has no administration in time range)  ketorolac (TORADOL) 30 MG/ML injection 30 mg (has no administration in time range)    ED Course  I have reviewed the triage vital signs and the nursing notes.  Pertinent labs & imaging results that were available during my care of the patient were reviewed by me and considered in my medical decision making (see chart for details).  Clinical Course as of 08/27/21 8937  Sat Aug 26, 2021  1536 CT ABDOMEN PELVIS W CONTRAST [JK]  1536 CBC unremarkable.  Metabolic panel shows hyperglycemia but no signs of acidosis [JK]  1536 Lipase normal.  Pregnancy test negative [JK]    Clinical Course User Index [JK] Dorie Rank, MD   MDM Rules/Calculators/A&P                           Presented with persistent abdominal pain.  Patient had laboratory tests and CT scans ordered at triage.  Labs are unremarkable.  CT scan was ordered due to her persistent upper abdominal pain to evaluate further.  CT scan was pending at the time of shift change and care was turned over to Dr. Francia Greaves. Final Clinical Impression(s) / ED Diagnoses pending    Dorie Rank, MD 08/27/21 [phone]

## 2022-10-10 IMAGING — CT CT ABD-PELV W/ CM
2 of 4 series · 16 of 46 positions shown, 18 images · IV contrast (OMNIPAQUE 350)
Comparison: Ultrasound February 12, 2021

CLINICAL DATA: Right-sided abdominal pain.

EXAM:
CT ABDOMEN AND PELVIS WITH CONTRAST
TECHNIQUE: Multidetector CT imaging of the abdomen and pelvis was performed
using the standard protocol following bolus administration of
intravenous contrast.
CONTRAST:  80mL OMNIPAQUE IOHEXOL 350 MG/ML SOLN

[Series 2: axial st · axial · 0.72mm/px · z∈[+1212,+1592]mm · 13 of 84 slices shown, 15 images]
[im 5/84  soft-tissue]
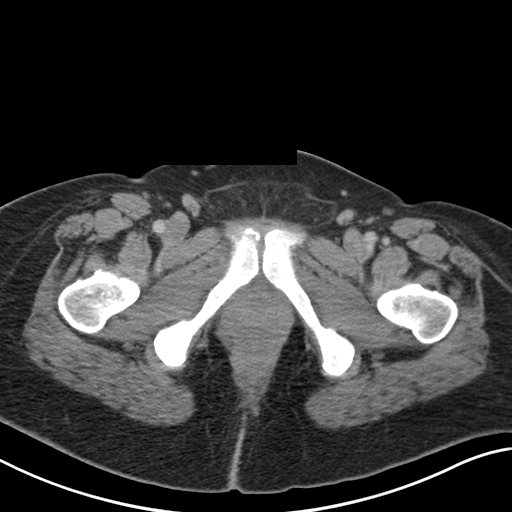
[im 5/84  bone]
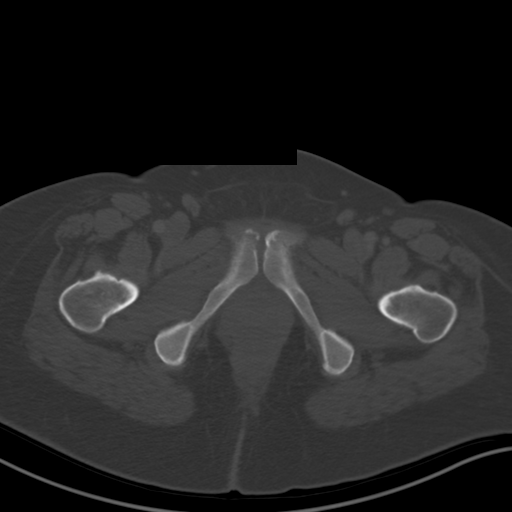
[im 10/84  soft-tissue]
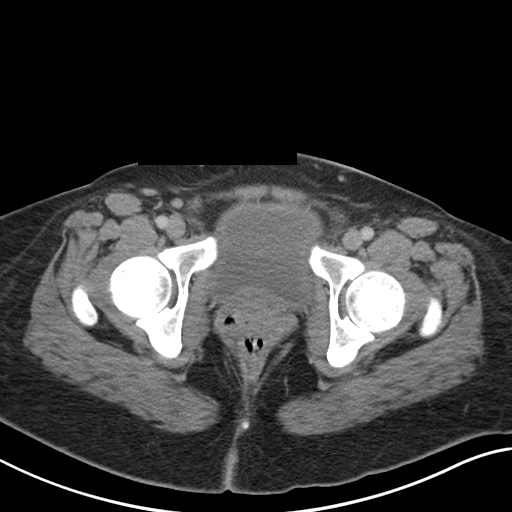
[im 20/84  soft-tissue]
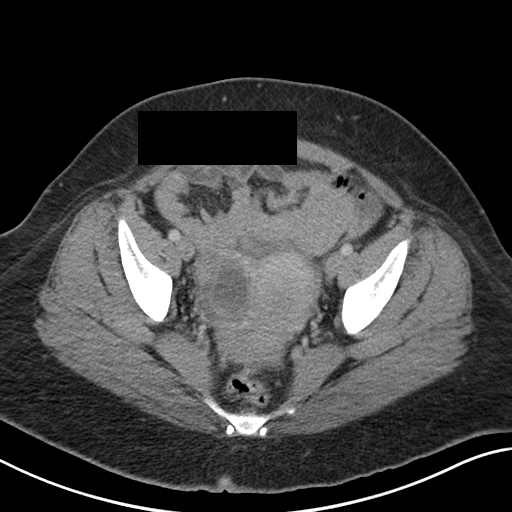
[im 25/84  soft-tissue]
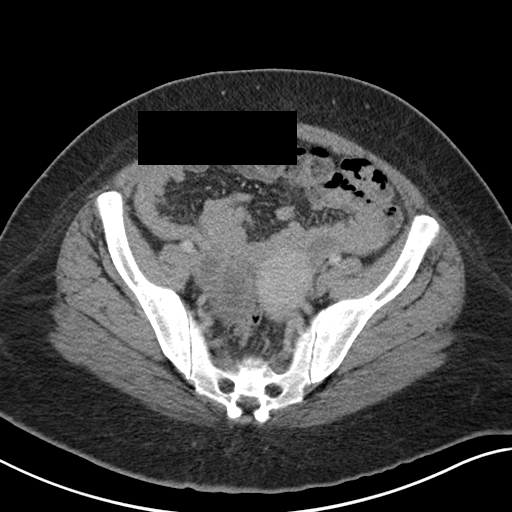
[im 30/84  soft-tissue]
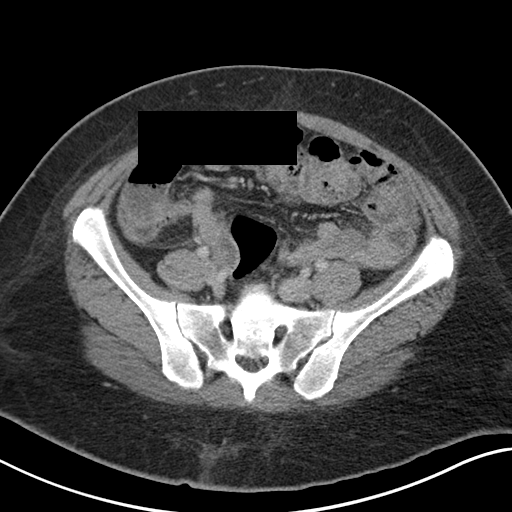
[im 35/84  soft-tissue]
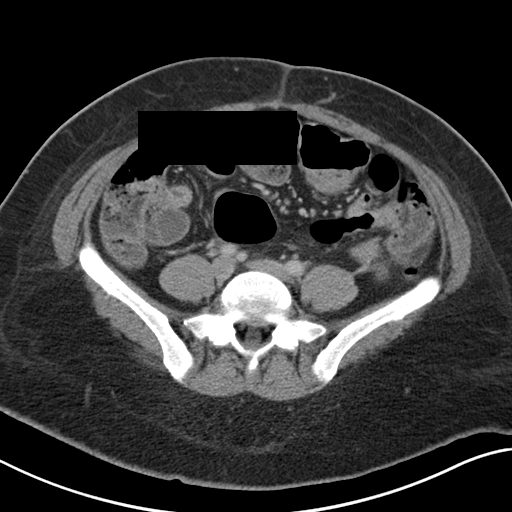
[im 44/84  soft-tissue]
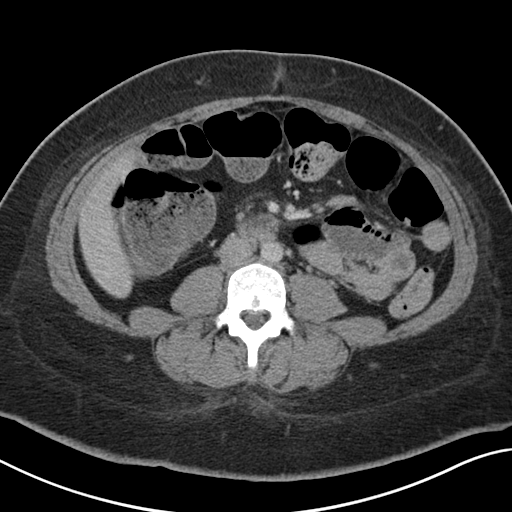
[im 49/84  soft-tissue]
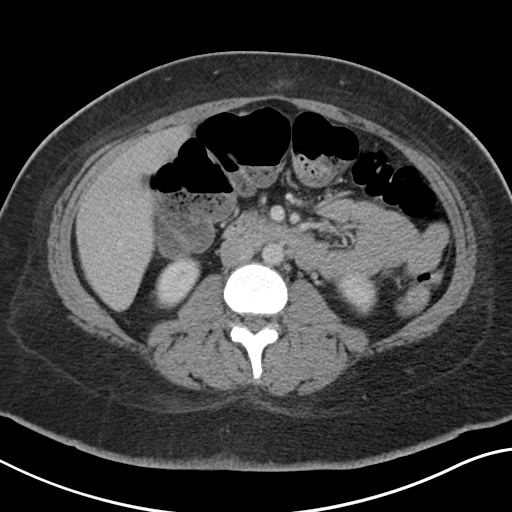
[im 54/84  soft-tissue]
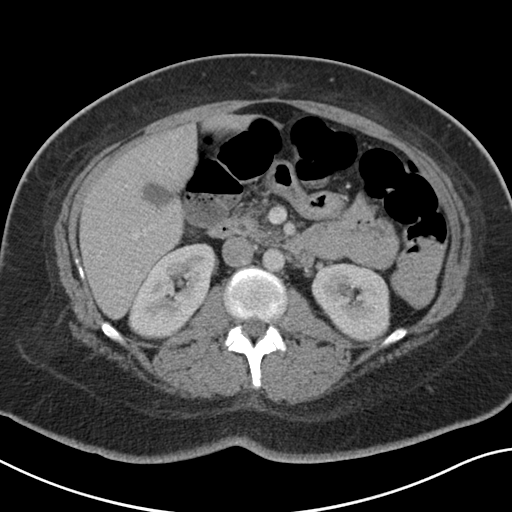
[im 54/84  bone]
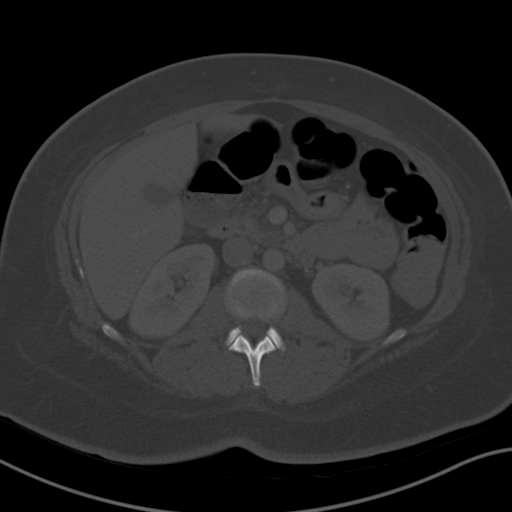
[im 59/84  soft-tissue]
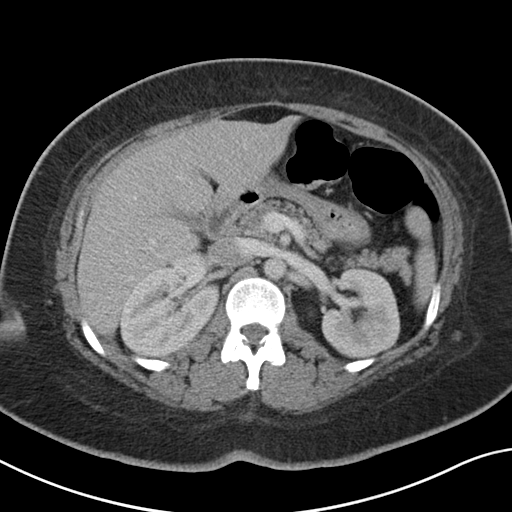
[im 64/84  soft-tissue]
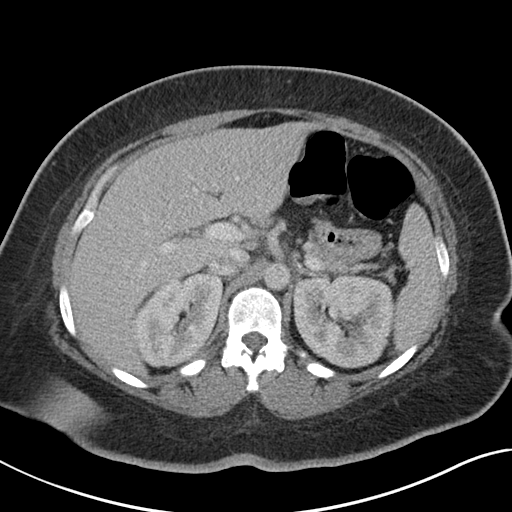
[im 74/84  soft-tissue]
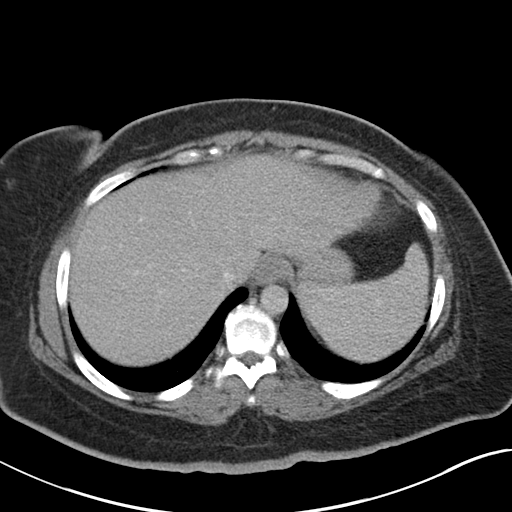
[im 79/84  soft-tissue]
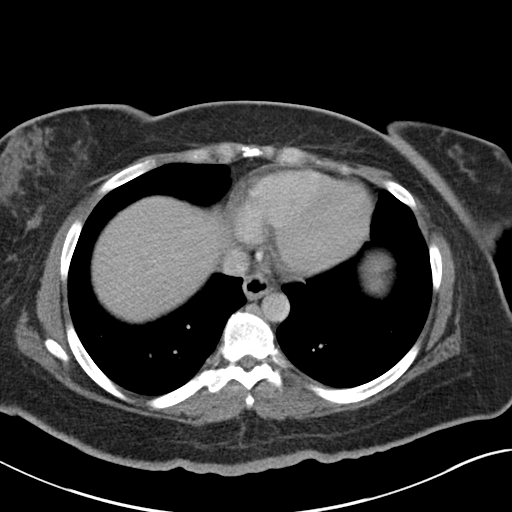

[Series 5: coronal st · coronal · 0.81mm/px · 3 of 154 slices shown]
[im 52/154  soft-tissue]
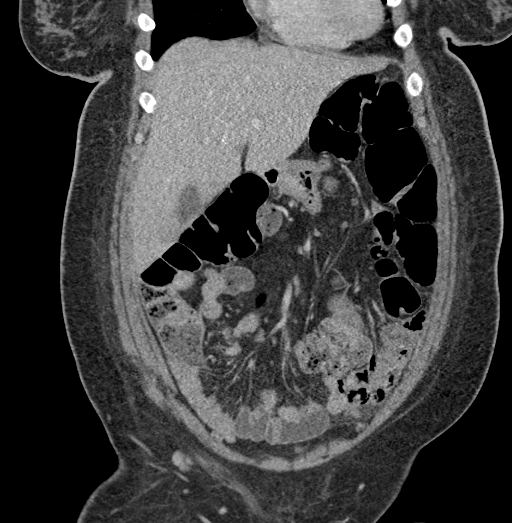
[im 69/154  soft-tissue]
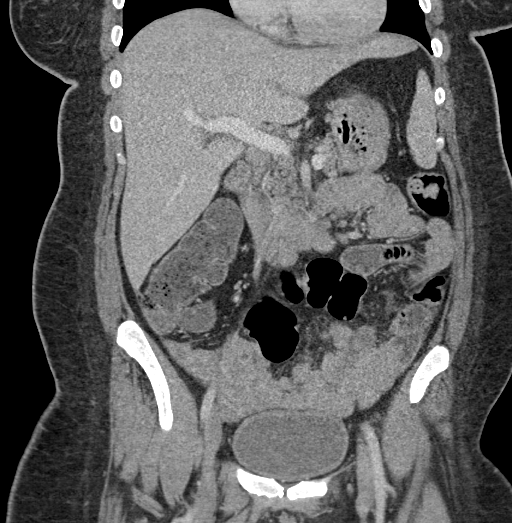
[im 86/154  soft-tissue]
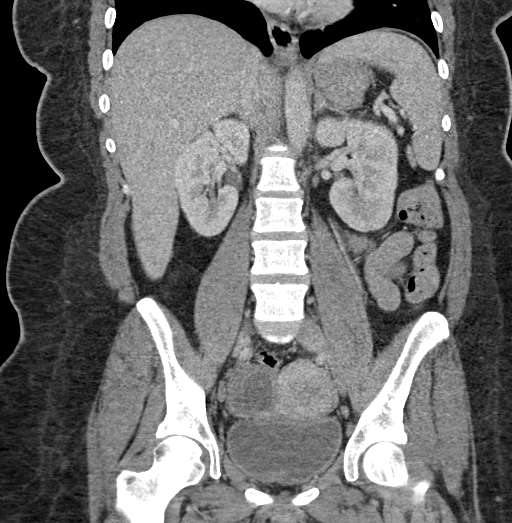

[16 of 46 positions shown; findings below may reference images not displayed]

FINDINGS: Lower chest: No acute abnormality.

Hepatobiliary: No suspicious hepatic lesion. Gallbladder is
decompressed. No biliary ductal dilation.

Pancreas: No pancreatic ductal dilation or evidence of acute
inflammation.

Spleen: Within normal limits.

Adrenals/Urinary Tract: Adrenal glands are unremarkable. Kidneys are
normal, without renal calculi, solid enhancing lesion, or
hydronephrosis. Small focus of gas in the urinary bladder, suggest
correlation with history of recent instrumentation.

Stomach/Bowel: No enteric contrast was administered. Small hiatal
hernia otherwise stomach is unremarkable for degree of distension.
No pathologic dilation of small or large bowel. The appendix and
terminal ileum appear normal. No evidence of acute bowel
inflammation.

Vascular/Lymphatic: No abdominal aortic aneurysm. No pathologically
enlarged abdominal or pelvic lymph nodes.

Reproductive: Uterus and left adnexa are unremarkable. Low-density
well-circumscribed 4.7 cm right ovarian cyst with slightly enlarged
edematous appearance of the right ovary.

Other: Trace pelvic free fluid which may be physiologic or reflect
rupture.

Musculoskeletal: No acute or significant osseous findings.
IMPRESSION: 1. Low-density well-circumscribed 4.7 cm right ovarian cyst with
slightly enlarged edematous appearance of the right ovary, given the
appearance of the ovary consider further evaluation with dedicated
pelvic ultrasound to assess for ovarian torsion.
2. Trace pelvic free fluid which may be physiologic or reflect
rupture.
3. Small focus of gas in the urinary bladder, suggest correlation
with history of recent instrumentation.
4. Normal appendix.  No evidence of acute bowel inflammation.
